# Patient Record
Sex: Female | Born: 1950
Health system: Southern US, Community
[De-identification: ages and names within clinical notes are randomized; demographics above are authoritative.]

## PROBLEM LIST (undated history)

## (undated) DIAGNOSIS — Z860100 Personal history of colon polyps, unspecified: Secondary | ICD-10-CM

## (undated) DIAGNOSIS — D649 Anemia, unspecified: Secondary | ICD-10-CM

## (undated) DIAGNOSIS — M6530 Trigger finger, unspecified finger: Secondary | ICD-10-CM

## (undated) DIAGNOSIS — M1990 Unspecified osteoarthritis, unspecified site: Secondary | ICD-10-CM

## (undated) DIAGNOSIS — E039 Hypothyroidism, unspecified: Secondary | ICD-10-CM

## (undated) HISTORY — DX: Personal history of colon polyps, unspecified: Z86.0100

## (undated) HISTORY — PX: COLONOSCOPY: SHX174

## (undated) HISTORY — DX: Anemia, unspecified: D64.9

## (undated) HISTORY — PX: POLYPECTOMY: SHX149

## (undated) HISTORY — PX: OTHER SURGICAL HISTORY: SHX169

## (undated) HISTORY — PX: WISDOM TOOTH EXTRACTION: SHX21

## (undated) HISTORY — PX: EXPLORATORY LAPAROTOMY: SUR591

## (undated) HISTORY — DX: Trigger finger, unspecified finger: M65.30

## (undated) HISTORY — DX: Personal history of colonic polyps: Z86.010

---

## 2005-10-15 LAB — HM COLONOSCOPY

## 2006-04-02 ENCOUNTER — Encounter: Admission: RE | Admit: 2006-04-02 | Discharge: 2006-04-02 | Payer: Self-pay | Admitting: Family Medicine

## 2007-11-03 ENCOUNTER — Ambulatory Visit: Payer: Self-pay | Admitting: Internal Medicine

## 2007-11-03 DIAGNOSIS — J309 Allergic rhinitis, unspecified: Secondary | ICD-10-CM | POA: Insufficient documentation

## 2007-11-03 DIAGNOSIS — E039 Hypothyroidism, unspecified: Secondary | ICD-10-CM

## 2007-11-03 DIAGNOSIS — M129 Arthropathy, unspecified: Secondary | ICD-10-CM | POA: Insufficient documentation

## 2007-11-03 DIAGNOSIS — Z86010 Personal history of colonic polyps: Secondary | ICD-10-CM | POA: Insufficient documentation

## 2007-11-03 DIAGNOSIS — D649 Anemia, unspecified: Secondary | ICD-10-CM | POA: Insufficient documentation

## 2007-11-04 ENCOUNTER — Ambulatory Visit: Payer: Self-pay | Admitting: Internal Medicine

## 2007-11-04 LAB — CONVERTED CEMR LAB: Vit D, 1,25-Dihydroxy: 28 — ABNORMAL LOW (ref 30–89)

## 2007-11-05 ENCOUNTER — Encounter: Admission: RE | Admit: 2007-11-05 | Discharge: 2007-11-05 | Payer: Self-pay | Admitting: Internal Medicine

## 2007-11-06 ENCOUNTER — Telehealth: Payer: Self-pay | Admitting: *Deleted

## 2007-11-06 LAB — CONVERTED CEMR LAB
Basophils Absolute: 0 10*3/uL (ref 0.0–0.1)
Bilirubin, Direct: 0.2 mg/dL (ref 0.0–0.3)
CO2: 28 meq/L (ref 19–32)
Eosinophils Absolute: 0.1 10*3/uL (ref 0.0–0.6)
Ferritin: 47.1 ng/mL (ref 10.0–291.0)
GFR calc Af Amer: 84 mL/min
Glucose, Bld: 96 mg/dL (ref 70–99)
HCT: 42.6 % (ref 36.0–46.0)
Hemoglobin: 14.6 g/dL (ref 12.0–15.0)
MCHC: 34.4 g/dL (ref 30.0–36.0)
MCV: 89.5 fL (ref 78.0–100.0)
Monocytes Absolute: 0.3 10*3/uL (ref 0.2–0.7)
Monocytes Relative: 7.3 % (ref 3.0–11.0)
Neutrophils Relative %: 57.5 % (ref 43.0–77.0)
Potassium: 5 meq/L (ref 3.5–5.1)
RBC: 4.76 M/uL (ref 3.87–5.11)
TSH: 1.27 microintl units/mL (ref 0.35–5.50)
Total Protein: 6.8 g/dL (ref 6.0–8.3)
Triglycerides: 59 mg/dL (ref 0–149)
Vitamin B-12: 553 pg/mL (ref 211–911)

## 2007-11-11 ENCOUNTER — Ambulatory Visit: Payer: Self-pay | Admitting: Internal Medicine

## 2007-11-11 DIAGNOSIS — E785 Hyperlipidemia, unspecified: Secondary | ICD-10-CM | POA: Insufficient documentation

## 2008-02-09 ENCOUNTER — Ambulatory Visit: Payer: Self-pay | Admitting: Internal Medicine

## 2008-02-10 ENCOUNTER — Telehealth: Payer: Self-pay | Admitting: Internal Medicine

## 2008-02-12 LAB — CONVERTED CEMR LAB
ALT: 33 units/L (ref 0–35)
AST: 23 units/L (ref 0–37)
Bilirubin, Direct: 0.1 mg/dL (ref 0.0–0.3)
Total Bilirubin: 0.8 mg/dL (ref 0.3–1.2)
Total Protein: 6.8 g/dL (ref 6.0–8.3)

## 2008-04-08 ENCOUNTER — Ambulatory Visit: Payer: Self-pay | Admitting: Internal Medicine

## 2008-04-08 LAB — CONVERTED CEMR LAB
Nitrite: NEGATIVE
WBC Urine, dipstick: NEGATIVE
pH: 6.5

## 2008-04-12 LAB — CONVERTED CEMR LAB
ALT: 29 units/L (ref 0–35)
AST: 24 units/L (ref 0–37)
Albumin: 4 g/dL (ref 3.5–5.2)
BUN: 14 mg/dL (ref 6–23)
Basophils Relative: 1.8 % — ABNORMAL HIGH (ref 0.0–1.0)
CO2: 32 meq/L (ref 19–32)
Chloride: 110 meq/L (ref 96–112)
Creatinine, Ser: 1 mg/dL (ref 0.4–1.2)
Eosinophils Absolute: 0.1 10*3/uL (ref 0.0–0.7)
Eosinophils Relative: 2.2 % (ref 0.0–5.0)
Lymphocytes Relative: 37.4 % (ref 12.0–46.0)
MCV: 90.6 fL (ref 78.0–100.0)
Neutrophils Relative %: 51.8 % (ref 43.0–77.0)
RBC: 4.89 M/uL (ref 3.87–5.11)
TSH: 1.23 microintl units/mL (ref 0.35–5.50)
Total Protein: 6.9 g/dL (ref 6.0–8.3)
VLDL: 13 mg/dL (ref 0–40)
WBC: 4 10*3/uL — ABNORMAL LOW (ref 4.5–10.5)

## 2008-04-13 ENCOUNTER — Ambulatory Visit: Payer: Self-pay | Admitting: Internal Medicine

## 2008-04-13 ENCOUNTER — Other Ambulatory Visit: Admission: RE | Admit: 2008-04-13 | Discharge: 2008-04-13 | Payer: Self-pay | Admitting: Internal Medicine

## 2008-04-13 ENCOUNTER — Encounter: Payer: Self-pay | Admitting: Internal Medicine

## 2008-06-02 ENCOUNTER — Ambulatory Visit: Payer: Self-pay | Admitting: Internal Medicine

## 2008-10-05 ENCOUNTER — Encounter: Payer: Self-pay | Admitting: Internal Medicine

## 2008-10-07 ENCOUNTER — Ambulatory Visit: Payer: Self-pay | Admitting: Internal Medicine

## 2008-10-07 LAB — CONVERTED CEMR LAB: Vit D, 1,25-Dihydroxy: 44 (ref 30–89)

## 2008-10-25 ENCOUNTER — Ambulatory Visit: Payer: Self-pay | Admitting: Internal Medicine

## 2008-10-25 LAB — CONVERTED CEMR LAB
Cholesterol, target level: 200 mg/dL
HDL goal, serum: 40 mg/dL

## 2008-12-20 ENCOUNTER — Telehealth: Payer: Self-pay | Admitting: *Deleted

## 2009-05-25 ENCOUNTER — Telehealth: Payer: Self-pay | Admitting: Internal Medicine

## 2009-06-09 ENCOUNTER — Ambulatory Visit: Payer: Self-pay | Admitting: Internal Medicine

## 2009-06-12 LAB — CONVERTED CEMR LAB
Albumin: 4.1 g/dL (ref 3.5–5.2)
Alkaline Phosphatase: 59 units/L (ref 39–117)
Basophils Relative: 1.2 % (ref 0.0–3.0)
Bilirubin, Direct: 0.1 mg/dL (ref 0.0–0.3)
Calcium: 9.8 mg/dL (ref 8.4–10.5)
Eosinophils Relative: 1.7 % (ref 0.0–5.0)
GFR calc non Af Amer: 68.53 mL/min (ref >60)
MCV: 89.9 fL (ref 78.0–100.0)
Monocytes Absolute: 0.4 10*3/uL (ref 0.1–1.0)
Monocytes Relative: 7.4 % (ref 3.0–12.0)
Neutrophils Relative %: 55.6 % (ref 43.0–77.0)
RBC: 4.74 M/uL (ref 3.87–5.11)
Sodium: 144 meq/L (ref 135–145)
TSH: 0.53 microintl units/mL (ref 0.35–5.50)
WBC: 4.8 10*3/uL (ref 4.5–10.5)

## 2009-12-27 ENCOUNTER — Telehealth: Payer: Self-pay | Admitting: *Deleted

## 2010-09-10 ENCOUNTER — Ambulatory Visit: Payer: Self-pay | Admitting: Internal Medicine

## 2010-09-10 LAB — CONVERTED CEMR LAB
Albumin: 4 g/dL (ref 3.5–5.2)
Basophils Absolute: 0 10*3/uL (ref 0.0–0.1)
Basophils Relative: 0.8 % (ref 0.0–3.0)
Bilirubin Urine: NEGATIVE
CO2: 28 meq/L (ref 19–32)
Calcium: 9.6 mg/dL (ref 8.4–10.5)
Chloride: 107 meq/L (ref 96–112)
Eosinophils Absolute: 0.1 10*3/uL (ref 0.0–0.7)
Glucose, Bld: 82 mg/dL (ref 70–99)
HCT: 42 % (ref 36.0–46.0)
HDL: 37.3 mg/dL — ABNORMAL LOW (ref >39.00)
Hemoglobin: 14.4 g/dL (ref 12.0–15.0)
Ketones, ur: NEGATIVE mg/dL
Lymphs Abs: 1.4 10*3/uL (ref 0.7–4.0)
MCHC: 34.3 g/dL (ref 30.0–36.0)
MCV: 89.3 fL (ref 78.0–100.0)
Monocytes Absolute: 0.4 10*3/uL (ref 0.1–1.0)
Neutro Abs: 3 10*3/uL (ref 1.4–7.7)
Nitrite: NEGATIVE
Potassium: 5.7 meq/L — ABNORMAL HIGH (ref 3.5–5.1)
RBC: 4.7 M/uL (ref 3.87–5.11)
RDW: 12.9 % (ref 11.5–14.6)
Sodium: 141 meq/L (ref 135–145)
Specific Gravity, Urine: 1.015 (ref 1.000–1.030)
TSH: 0.8 microintl units/mL (ref 0.35–5.50)
Total CHOL/HDL Ratio: 4
Total Protein, Urine: NEGATIVE mg/dL
Total Protein: 6.4 g/dL (ref 6.0–8.3)
Triglycerides: 62 mg/dL (ref 0.0–149.0)
pH: 6 (ref 5.0–8.0)

## 2010-09-25 ENCOUNTER — Encounter: Payer: Self-pay | Admitting: Internal Medicine

## 2010-09-25 ENCOUNTER — Ambulatory Visit: Payer: Self-pay | Admitting: Internal Medicine

## 2010-10-10 ENCOUNTER — Encounter: Admission: RE | Admit: 2010-10-10 | Discharge: 2010-10-10 | Payer: Self-pay | Admitting: Internal Medicine

## 2010-10-10 LAB — HM MAMMOGRAPHY

## 2010-12-23 ENCOUNTER — Encounter: Payer: Self-pay | Admitting: Family Medicine

## 2011-01-01 NOTE — Assessment & Plan Note (Signed)
Summary: cpx/ssc/pt rsc/cjr   Vital Signs:  Patient profile:   60 year old female Menstrual status:  postmenopausal Height:      65.5 inches Weight:      171 pounds BMI:     28.12 Pulse rate:   78 / minute BP sitting:   120 / 80  (right arm) Cuff size:   regular  Vitals Entered By: Romualdo Bolk, CMA (AAMA) (September 25, 2010 1:40 PM) CC: CPX- Discuss doing a pap.   History of Present Illness: Denise Garrett comes in today  for  preventive visit .  Since last visit  here  there have been no major changes in health status  . feels well  and no injury .   last pap and dexa ok .  Thyroid no change in meds needs refill    Preventive Care Screening  Pap Smear:    Date:  04/13/2008    Results:  normal   Mammogram:    Date:  12/03/2007    Results:  normal   Prior Values:    Pap Smear:  Normal (12/02/2004)    Mammogram:  Normal Bilateral (12/02/2004)    Colonoscopy:  Normal (10/15/2005)   Preventive Screening-Counseling & Management  Alcohol-Tobacco     Alcohol drinks/day: 3     Alcohol type: wine, beer     Smoking Status: never  Caffeine-Diet-Exercise     Caffeine use/day: 2     Does Patient Exercise: yes     Type of exercise: bike, walking, yoga     Times/week: 5  Hep-HIV-STD-Contraception     Dental Visit-last 6 months yes     Sun Exposure-Excessive: no  Safety-Violence-Falls     Seat Belt Use: yes     Firearms in the Home: no firearms in the home     Smoke Detectors: yes     Fall Risk: none      Blood Transfusions:  no.    Current Medications (verified): 1)  Synthroid 88 Mcg  Tabs (Levothyroxine Sodium) .Marland Kitchen.. 1 By Mouth Once Daily 2)  Claritin 10 Mg  Tabs (Loratadine) 3)  Vitamin D 1000 Unit  Tabs (Cholecalciferol) 4)  Tumeric  Allergies (verified): 1)  ! Codeine  Past History:  Past medical, surgical, family and social histories (including risk factors) reviewed, and no changes noted (except as noted below).  Past Medical  History: Allergic rhinitis Anemia-NOS g4p3 Hypothyroidism   dexa 2009  Colonic polyps, hx of     Past Surgical History: Reviewed history from 10/25/2008 and no changes required. Bunion removed Laparotomy-exploratory   Past History:  Care Management: None Current Gastroenterology: unsure of name  Family History: Reviewed history from 06/02/2008 and no changes required. Family History of Cardiovascular disorder-Father-sudden heart attack, Brother quad. bypass at age 77 Family History of CAD Female 1st degree relative  father in 15s        Social History: Reviewed history from 10/25/2008 and no changes required. Occupation: Designer, industrial/product Asst.  [employer] Married Never Smoked Alcohol use-yes   Drug use-no Regular exercise-yes household of 2      pets cats  Dental Care w/in 6 mos.:  yes Seat Belt Use:  yes Sun Exposure-Excessive:  no Fall Risk:  none Blood Transfusions:  no  Review of Systems       had a skipped beat when climbed  to 11th floor   one time    no cp sob GI GU signs bleeding Lumps ,  allergy reaction  .  Physical Exam  General:  Well-developed,well-nourished,in no acute distress; alert,appropriate and cooperative throughout examination Head:  normocephalic and atraumatic.   Eyes:  vision grossly intact, pupils equal, and pupils round.   Ears:  R ear normal, L ear normal, and no external deformities.   Nose:  no external deformity, no external erythema, and no nasal discharge.   Mouth:  good dentition and pharynx pink and moist.   Neck:  No deformities, masses, or tenderness noted. Chest Wall:  No deformities, masses, or tenderness noted. Breasts:  No mass, nodules, thickening, tenderness, bulging, retraction, inflamation, nipple discharge or skin changes noted.   Lungs:  Normal respiratory effort, chest expands symmetrically. Lungs are clear to auscultation, no crackles or wheezes. Heart:  Normal rate and regular rhythm. S1 and S2 normal without gallop,  murmur, click, rub or other extra sounds. Abdomen:  Bowel sounds positive,abdomen soft and non-tender without masses, organomegaly or hernias noted. Genitalia:  defered Msk:  no joint swelling, no joint warmth, no redness over joints, and no joint deformities.   Pulses:  pulses intact without delay   Extremities:  no clubbing cyanosis or edema  Neurologic:  alert & oriented X3, cranial nerves II-XII intact, strength normal in all extremities, gait normal, and DTRs symmetrical and normal.   Skin:  turgor normal, color normal, no ecchymoses, no petechiae, and no purpura.   Cervical Nodes:  No lymphadenopathy noted Axillary Nodes:  No palpable lymphadenopathy Inguinal Nodes:  No significant adenopathy Psych:  Normal eye contact, appropriate affect. Cognition appears normal.    Impression & Recommendations:  Problem # 1:  PREVENTIVE HEALTH CARE (ICD-V70.0) Discussed nutrition,exercise,diet,healthy weight, vitamin D and calcium.   Problem # 2:  HYPOTHYROIDISM (ICD-244.9)  no change in meds    Her updated medication list for this problem includes:    Synthroid 88 Mcg Tabs (Levothyroxine sodium) .Marland Kitchen... 1 by mouth once daily  Labs Reviewed: TSH: 0.80 (09/10/2010)    Chol: 165 (09/10/2010)   HDL: 37.30 (09/10/2010)   LDL: 115 (09/10/2010)   TG: 62.0 (09/10/2010)  Problem # 3:  HYPERLIPIDEMIA (ICD-272.2) inc exercise  avoid.   transfats for low HDL.   Labs Reviewed: SGOT: 21 (09/10/2010)   SGPT: 27 (09/10/2010)  Lipid Goals: Chol Goal: 200 (10/25/2008)   HDL Goal: 40 (10/25/2008)   LDL Goal: 130 (10/25/2008)   TG Goal: 150 (10/25/2008)  Prior 10 Yr Risk Heart Disease: 9 % (10/25/2008)   HDL:37.30 (09/10/2010), 30.7 (10/07/2008)  LDL:115 (09/10/2010), 123 (10/07/2008)  Chol:165 (09/10/2010), 165 (10/07/2008)  Trig:62.0 (09/10/2010), 58 (10/07/2008)  Complete Medication List: 1)  Synthroid 88 Mcg Tabs (Levothyroxine sodium) .Marland Kitchen.. 1 by mouth once daily 2)  Claritin 10 Mg Tabs  (Loratadine) 3)  Vitamin D 1000 Unit Tabs (Cholecalciferol) 4)  Tumeric   Other Orders: Admin 1st Vaccine (16109) Flu Vaccine 40yrs + (60454) Tdap => 7yrs IM (09811) Admin of Any Addtl Vaccine (91478)  Patient Instructions: 1)  get a mammogram this year . 2)  Next year  to do a PAP and DEXA scan. 3)  call if needed in the meantime  Prescriptions: SYNTHROID 88 MCG  TABS (LEVOTHYROXINE SODIUM) 1 by mouth once daily  #90 Tablet x 0   Entered and Authorized by:   Madelin Headings MD   Signed by:   Madelin Headings MD on 09/25/2010   Method used:   Electronically to        [facility]* (retail)             ,  Ph: [phone]       Fax: [phone]   RxID:   [id number]    Orders Added: 1)  Admin 1st Vaccine [90471] 2)  Flu Vaccine 58yrs + [62952] 3)  Tdap => 21yrs IM [84132] 4)  Admin of Any Addtl Vaccine [90472] 5)  Est. Patient 40-64 years [44010]   Immunizations Administered:  Tetanus Vaccine:    Vaccine Type: Tdap    Site: right deltoid    Mfr: GlaxoSmithKline    Dose: 0.5 ml    Route: IM    Given by: Romualdo Bolk, CMA (AAMA)    Exp. Date: 09/20/2012    Lot #: [id number]   Immunizations Administered:  Tetanus Vaccine:    Vaccine Type: Tdap    Site: right deltoid    Mfr: GlaxoSmithKline    Dose: 0.5 ml    Route: IM    Given by: Romualdo Bolk, CMA (AAMA)    Exp. Date: 09/20/2012    Lot #: [id number]  Prevention & Chronic Care Immunizations   Influenza vaccine: Fluvax 3+  (09/25/2010)    Tetanus booster: 09/25/2010: Tdap    Pneumococcal vaccine: Not documented  Colorectal Screening   Hemoccult: Not documented    Colonoscopy: Normal  (10/15/2005)  Other Screening   Pap smear: normal  (04/13/2008)    Mammogram: normal  (12/03/2007)   Mammogram action/deferral: mmg scheduled  (11/03/2007)   Smoking status: never  (09/25/2010)  Lipids   Total Cholesterol: 165  (09/10/2010)   LDL: 115  (09/10/2010)   LDL Direct: Not  documented   HDL: 37.30  (09/10/2010)   Triglycerides: 62.0  (09/10/2010)    SGOT (AST): 21  (09/10/2010)   SGPT (ALT): 27  (09/10/2010)   Alkaline phosphatase: 56  (09/10/2010)   Total bilirubin: 0.8  (09/10/2010)  Self-Management Support :    Lipid self-management support: Not documented   Flu Vaccine Consent Questions     Do you have a history of severe allergic reactions to this vaccine? no    Any prior history of allergic reactions to egg and/or gelatin? no    Do you have a sensitivity to the preservative Thimersol? no    Do you have a past history of Guillan-Barre Syndrome? no    Do you currently have an acute febrile illness? no    Have you ever had a severe reaction to latex? no    Vaccine information given and explained to patient? yes    Are you currently pregnant? no    Lot Number:[id number]   Exp Date:06/01/2011   Site Given  Left Deltoid IM.lbflu1 Romualdo Bolk, CMA (AAMA)  September 25, 2010 1:46 PM   Appended Document: Orders Update EKG  orders  normal EKG    Clinical Lists Changes  Orders: Added new Service order of EKG w/ Interpretation (93000) - Signed

## 2011-01-01 NOTE — Progress Notes (Signed)
Summary: Pt req script for Synthroid via [facility]  Phone Note Call from Patient Call back at Work Phone [phone]   Caller: Patient Summary of Call: Pt req new script for Synthroid via [facility].  Initial call taken by: Lucy Antigua,  December 27, 2009 9:40 AM  Follow-up for Phone Call        Rx sent electronically Follow-up by: Romualdo Bolk, CMA Duncan Dull),  December 27, 2009 12:04 PM    Prescriptions: SYNTHROID 88 MCG  TABS (LEVOTHYROXINE SODIUM) 1 by mouth once daily  #90 x 1   Entered by:   Romualdo Bolk, CMA (AAMA)   Authorized by:   Madelin Headings MD   Signed by:   Romualdo Bolk, CMA (AAMA) on 12/27/2009   Method used:   Electronically to        [facility]* (mail-order)             ,          Ph: [phone]       Fax: [phone]   RxID:   [id number]

## 2011-01-04 ENCOUNTER — Other Ambulatory Visit: Payer: Self-pay | Admitting: Internal Medicine

## 2011-01-14 ENCOUNTER — Encounter: Payer: Self-pay | Admitting: Internal Medicine

## 2011-01-14 ENCOUNTER — Ambulatory Visit (INDEPENDENT_AMBULATORY_CARE_PROVIDER_SITE_OTHER): Payer: BC Managed Care – PPO | Admitting: Internal Medicine

## 2011-01-14 DIAGNOSIS — J40 Bronchitis, not specified as acute or chronic: Secondary | ICD-10-CM | POA: Insufficient documentation

## 2011-01-14 MED ORDER — AMOXICILLIN 875 MG PO TABS: 875.0000 mg | ORAL_TABLET | Freq: Two times a day (BID) | ORAL | Status: AC

## 2011-01-14 MED ORDER — BENZONATATE 100 MG PO CAPS: 100.0000 mg | ORAL_CAPSULE | Freq: Three times a day (TID) | ORAL | Status: AC | PRN

## 2011-01-14 NOTE — Assessment & Plan Note (Signed)
Begin abx tx and tessalon perles prn. Followup if no improvement or worsening.

## 2011-01-14 NOTE — Progress Notes (Signed)
  Subjective:    Patient ID: Denise Garrett, female    DOB: December 02, 1951, 60 y.o.   MRN: [id number]  HPI Pt presents to clinic for evaluation of cough. Notes 8 day h/o NP cough with nasal drainage and congestion. Denies f/c, dysnpea or wheezing. Has attempted otc medication without improvement. Cough worse at night and is impairing sleep. No other alleviating or exacerbating factors.  Reviewed PMH, medications and allergies.    Review of Systems  Constitutional: Negative for fever, chills and fatigue.  HENT: Positive for congestion and rhinorrhea. Negative for ear pain, facial swelling and ear discharge.   Eyes: Negative for discharge and redness.  Respiratory: Positive for cough. Negative for shortness of breath and wheezing.   Skin: Negative for color change and rash.       Objective:   Physical Exam  Constitutional: She appears well-developed and well-nourished. No distress.  HENT:  Head: Normocephalic and atraumatic.  Right Ear: Tympanic membrane, external ear and ear canal normal.  Left Ear: Tympanic membrane, external ear and ear canal normal.  Nose: Nose normal.  Mouth/Throat: Oropharynx is clear and moist. No oropharyngeal exudate.  Eyes: Conjunctivae are normal. No scleral icterus.  Neck: Neck supple.  Cardiovascular: Normal rate, regular rhythm and normal heart sounds.  Exam reveals no gallop and no friction rub.   No murmur heard. Pulmonary/Chest: Effort normal. No respiratory distress. She has no wheezes. She has rales.  Lymphadenopathy:    She has no cervical adenopathy.  Neurological: She is alert.  Skin: No rash noted. She is not diaphoretic. No erythema.          Assessment & Plan:

## 2011-11-30 ENCOUNTER — Other Ambulatory Visit: Payer: Self-pay | Admitting: Internal Medicine

## 2011-12-24 ENCOUNTER — Telehealth: Payer: Self-pay | Admitting: Internal Medicine

## 2011-12-24 NOTE — Telephone Encounter (Signed)
Pt last cpx 2011. Pt is requesting cpx before march. Can I create 30 min slot?

## 2011-12-25 NOTE — Telephone Encounter (Signed)
Yes;   Not on thurs or Friday afternoon

## 2011-12-26 NOTE — Telephone Encounter (Signed)
Lmom for pt to call back. 

## 2011-12-27 NOTE — Telephone Encounter (Signed)
Pt will call back on monday

## 2011-12-31 NOTE — Telephone Encounter (Signed)
cpx sch for 02-14-2012 11am

## 2012-02-10 ENCOUNTER — Other Ambulatory Visit (INDEPENDENT_AMBULATORY_CARE_PROVIDER_SITE_OTHER): Payer: BC Managed Care – PPO

## 2012-02-10 DIAGNOSIS — Z0000 Encounter for general adult medical examination without abnormal findings: Secondary | ICD-10-CM

## 2012-02-10 LAB — HEPATIC FUNCTION PANEL
Alkaline Phosphatase: 53 U/L (ref 39–117)
Bilirubin, Direct: 0.1 mg/dL (ref 0.0–0.3)
Total Bilirubin: 0.4 mg/dL (ref 0.3–1.2)
Total Protein: 6.5 g/dL (ref 6.0–8.3)

## 2012-02-10 LAB — CBC WITH DIFFERENTIAL/PLATELET
Basophils Absolute: 0 10*3/uL (ref 0.0–0.1)
Eosinophils Absolute: 0.1 10*3/uL (ref 0.0–0.7)
Lymphocytes Relative: 31.2 % (ref 12.0–46.0)
MCHC: 33.5 g/dL (ref 30.0–36.0)
MCV: 89.1 fl (ref 78.0–100.0)
Monocytes Absolute: 0.3 10*3/uL (ref 0.1–1.0)
Neutrophils Relative %: 58.6 % (ref 43.0–77.0)
Platelets: 198 10*3/uL (ref 150.0–400.0)
RDW: 13.5 % (ref 11.5–14.6)

## 2012-02-10 LAB — POCT URINALYSIS DIPSTICK
Glucose, UA: NEGATIVE
Spec Grav, UA: 1.02
Urobilinogen, UA: 0.2
pH, UA: 6

## 2012-02-10 LAB — BASIC METABOLIC PANEL
BUN: 20 mg/dL (ref 6–23)
CO2: 27 mEq/L (ref 19–32)
Calcium: 9.2 mg/dL (ref 8.4–10.5)
Chloride: 105 mEq/L (ref 96–112)
Creatinine, Ser: 0.9 mg/dL (ref 0.4–1.2)

## 2012-02-10 LAB — LIPID PANEL
Cholesterol: 164 mg/dL (ref 0–200)
HDL: 42.4 mg/dL (ref >39.00)
LDL Cholesterol: 107 mg/dL — ABNORMAL HIGH (ref 0–99)
Total CHOL/HDL Ratio: 4
Triglycerides: 71 mg/dL (ref 0.0–149.0)
VLDL: 14.2 mg/dL (ref 0.0–40.0)

## 2012-02-14 ENCOUNTER — Other Ambulatory Visit (HOSPITAL_COMMUNITY)
Admission: RE | Admit: 2012-02-14 | Discharge: 2012-02-14 | Disposition: A | Discharge status: Home / Self Care | Payer: BC Managed Care – PPO | Source: Ambulatory Visit | Attending: Internal Medicine | Admitting: Internal Medicine

## 2012-02-14 ENCOUNTER — Encounter: Payer: Self-pay | Admitting: Internal Medicine

## 2012-02-14 ENCOUNTER — Ambulatory Visit (INDEPENDENT_AMBULATORY_CARE_PROVIDER_SITE_OTHER): Payer: BC Managed Care – PPO | Admitting: Internal Medicine

## 2012-02-14 VITALS — BP 148/80 | HR 64 | Ht 65.25 in | Wt 175.0 lb

## 2012-02-14 DIAGNOSIS — Z01419 Encounter for gynecological examination (general) (routine) without abnormal findings: Secondary | ICD-10-CM | POA: Insufficient documentation

## 2012-02-14 DIAGNOSIS — E039 Hypothyroidism, unspecified: Secondary | ICD-10-CM

## 2012-02-14 DIAGNOSIS — E782 Mixed hyperlipidemia: Secondary | ICD-10-CM

## 2012-02-14 DIAGNOSIS — R030 Elevated blood-pressure reading, without diagnosis of hypertension: Secondary | ICD-10-CM

## 2012-02-14 DIAGNOSIS — Z0000 Encounter for general adult medical examination without abnormal findings: Secondary | ICD-10-CM

## 2012-02-14 DIAGNOSIS — J309 Allergic rhinitis, unspecified: Secondary | ICD-10-CM

## 2012-02-14 MED ORDER — LEVOTHYROXINE SODIUM 88 MCG PO TABS: 88.0000 ug | ORAL_TABLET | Freq: Every day | ORAL | Status: DC

## 2012-02-14 NOTE — Patient Instructions (Signed)
Your exam is normal today but her blood pressure is elevated on repeat 140/78.  Please recheck your blood pressure at least 3 different times on different days. If they are below 140/90 and you can check it periodically.  If you get continued blood pressure 140/90 and above  contact us for followup.  -Diet is very helpful for blood pressure control as well as exercise.  We will contact you with Pap smear results.  Continue same thyroid medication optimize calcium and vitamin D in your diet  Check up with labs in a year .

## 2012-02-14 NOTE — Progress Notes (Signed)
Subjective:    Patient ID: Denise Garrett, female    DOB: Aug 10, 1951, 61 y.o.   MRN: [id number]  HPI Patient comes in today for preventive visit and follow-up of medical issues. Update  history since  last visit: Dairy eating healthy.   Some milk takes vitamin D. Thyroid on generic the change in medication taking regularly Blood pressure doesn't check it is never had elevations and no family history. Hasn't been exercising as much recently for nonphysical reasons.   Review of Systems ROS:  GEN/ HEENT: No fever, significant weight changes sweats headaches vision problems hearing changes, CV/ PULM; No chest pain shortness of breath cough, syncope,edema  change in exercise tolerance. GI /GU: No adominal pain, vomiting, change in bowel habits. No blood in the stool. No significant GU symptoms. SKIN/HEME: ,no acute skin rashes suspicious lesions or bleeding. No lymphadenopathy, nodules, masses.  NEURO/ PSYCH:  No neurologic signs such as weakness numbness. No depression anxiety. IMM/ Allergy: No unusual infections.  Allergy .  Sleep disrupted a lot because [place]. Gets woken to good bit.   REST of 12 system review negative except as per HPI  Past history family history social history reviewed in the electronic medical record. Outpatient Prescriptions Prior to Visit  Medication Sig Dispense Refill  . cholecalciferol (VITAMIN D) 1000 UNITS tablet Take 1,000 Units by mouth daily.        Marland Kitchen loratadine (CLARITIN) 10 MG tablet Take 10 mg by mouth daily.        Marland Kitchen levothyroxine (SYNTHROID, LEVOTHROID) 88 MCG tablet TAKE 1 TABLET DAILY  90 tablet  3        Objective:   Physical Exam  Wt Readings from Last 3 Encounters:  02/14/12 175 lb (79.379 kg)  01/14/11 170 lb (77.111 kg)  09/25/10 171 lb (77.565 kg)    Physical Exam: Vital signs reviewed UXL:KGMW is a well-developed well-nourished alert cooperative  white female who appears her stated age in no acute  distress.  HEENT: normocephalic atraumatic , Eyes: PERRL EOM's full, conjunctiva clear, Nares: paten,t no deformity discharge or tenderness., Ears: no deformity EAC's clear TMs with normal landmarks. Mouth: clear OP, no lesions, edema.  Moist mucous membranes. Dentition in adequate repair. NECK: supple without masses, thyromegaly or bruits. CHEST/PULM:  Clear to auscultation and percussion breath sounds equal no wheeze , rales or rhonchi. No chest wall deformities or tenderness. CV: PMI is nondisplaced, S1 S2 no gallops, murmurs, rubs. Peripheral pulses are full without delay.No JVD .  Breast: normal by inspection . No dimpling, discharge, masses, tenderness or discharge . ABDOMEN: Bowel sounds normal nontender  No guard or rebound, no hepato splenomegal no CVA tenderness.  No hernia. Extremtities:  No clubbing cyanosis or edema, no acute joint swelling or redness no focal atrophy NEURO:  Oriented x3, cranial nerves 3-12 appear to be intact, no obvious focal weakness,gait within normal limits no abnormal reflexes or asymmetrical SKIN: No acute rashes normal turgor, color, no bruising or petechiae. PSYCH: Oriented, good eye contact, no obvious depression anxiety, cognition and judgment appear normal. LN: no cervical axillary inguinal adenopathy  Pelvic: NL ext GU, labia clear without lesions or rash . Vagina no lesions .Cervix: clear   Slight flat erythema  No lesionUTERUS: Neg CMT Adnexa:  clear no masses . PAP done rectal neg stool hem tagpresent  Lab Results  Component Value Date   WBC 4.4* 02/10/2012   HGB 13.9 02/10/2012   HCT 41.5 02/10/2012  PLT 198.0 02/10/2012   GLUCOSE 93 02/10/2012   CHOL 164 02/10/2012   TRIG 71.0 02/10/2012   HDL 42.40 02/10/2012   LDLCALC 107* 02/10/2012   ALT 26 02/10/2012   AST 18 02/10/2012   NA 138 02/10/2012   K 4.5 02/10/2012   CL 105 02/10/2012   CREATININE 0.9 02/10/2012   BUN 20 02/10/2012   CO2 27 02/10/2012   TSH 0.79 02/10/2012     Reviewed with patient     Assessment & Plan:  Preventive Health Care Counseled regarding healthy nutrition, exercise, sleep, injury prevention, calcium vit d and healthy weight . Optimize calcium and vitamin D begin exercise her bone health. Disc sleep try whit noise to help with sleep interference   Thyroid   in range continue medication checkup yearly Elevated Bp readings  decreased on repeat should follow this up on her and if elevated get back with [place]. Reviewed normal blood pressure goal readings.  Lipids they're much better over the last 4-5 years continue healthy lifestyle eating. Ratio now in the 4 range

## 2012-02-18 NOTE — Progress Notes (Signed)
Quick Note:  Tell patient PAP is normal. ______ 

## 2012-02-19 ENCOUNTER — Encounter: Payer: Self-pay | Admitting: *Deleted

## 2012-02-19 NOTE — Progress Notes (Signed)
Quick Note:    Letter sent to pt.  ______

## 2012-02-19 NOTE — Progress Notes (Signed)
Quick Note:  Left a message for pt to return call. ______ 

## 2012-07-01 ENCOUNTER — Encounter: Payer: Self-pay | Admitting: Internal Medicine

## 2012-07-01 ENCOUNTER — Ambulatory Visit (INDEPENDENT_AMBULATORY_CARE_PROVIDER_SITE_OTHER): Payer: BC Managed Care – PPO | Admitting: Internal Medicine

## 2012-07-01 VITALS — BP 120/78 | HR 64 | Temp 98.5°F | Wt 172.0 lb

## 2012-07-01 DIAGNOSIS — R194 Change in bowel habit: Secondary | ICD-10-CM

## 2012-07-01 DIAGNOSIS — K5900 Constipation, unspecified: Secondary | ICD-10-CM

## 2012-07-01 DIAGNOSIS — R198 Other specified symptoms and signs involving the digestive system and abdomen: Secondary | ICD-10-CM

## 2012-07-01 NOTE — Patient Instructions (Signed)
It is possible that your change in bowel habits was related to trial him when sure constipated he takes a little while to get back to normal. A we will try some things and if is not improving we will get gastroenterology to see you otherwise we'll refer you at some point for your routine colonoscopy.   Would have to use over-the-counter generic MiraLax one capful a day until going on a regular basis and then as needed.  Continue healthy diet.  If your bowel habits have not returned to baseline  in the next month we will get a gastroenterology consult. Contact us for this.   Constipation in Adults Constipation is having fewer than 2 bowel movements per week. Usually, the stools are hard. As we grow older, constipation is more common. If you try to fix constipation with laxatives, the problem may get worse. This is because laxatives taken over a long period of time make the colon muscles weaker. A low-fiber diet, not taking in enough fluids, and taking some medicines may make these problems worse. MEDICATIONS THAT MAY CAUSE CONSTIPATION  Water pills (diuretics).   Calcium channel blockers (used to control blood pressure and for the heart).   Certain pain medicines (narcotics).   Anticholinergics.   Anti-inflammatory agents.   Antacids that contain aluminum.  DISEASES THAT CONTRIBUTE TO CONSTIPATION  Diabetes.   Parkinson's disease.   Dementia.   Stroke.   Depression.   Illnesses that cause problems with salt and water metabolism.  HOME CARE INSTRUCTIONS   Constipation is usually best cared for without medicines. Increasing dietary fiber and eating more fruits and vegetables is the best way to manage constipation.   Slowly increase fiber intake to 25 to 38 grams per day. Whole grains, fruits, vegetables, and legumes are good sources of fiber. A dietitian can further help you incorporate high-fiber foods into your diet.   Drink enough water and fluids to keep your urine clear  or pale yellow.   A fiber supplement may be added to your diet if you cannot get enough fiber from foods.   Increasing your activities also helps improve regularity.   Suppositories, as suggested by your caregiver, will also help. If you are using antacids, such as aluminum or calcium containing products, it will be helpful to switch to products containing magnesium if your caregiver says it is okay.   If you have been given a liquid injection (enema) today, this is only a temporary measure. It should not be relied on for treatment of longstanding (chronic) constipation.   Stronger measures, such as magnesium sulfate, should be avoided if possible. This may cause uncontrollable diarrhea. Using magnesium sulfate may not allow you time to make it to the bathroom.  SEEK IMMEDIATE MEDICAL CARE IF:   There is bright red blood in the stool.   The constipation stays for more than 4 days.   There is belly (abdominal) or rectal pain.   You do not seem to be getting better.   You have any questions or concerns.  MAKE SURE YOU:   Understand these instructions.   Will watch your condition.   Will get help right away if you are not doing well or get worse.  Document Released: 08/16/2004 Document Revised: 11/07/2011 Document Reviewed: 10/22/2011 [facility] Patient Information 2012 [place], [place].

## 2012-07-04 DIAGNOSIS — K5900 Constipation, unspecified: Secondary | ICD-10-CM | POA: Insufficient documentation

## 2012-07-04 DIAGNOSIS — R194 Change in bowel habit: Secondary | ICD-10-CM | POA: Insufficient documentation

## 2012-07-04 NOTE — Progress Notes (Signed)
Subjective:    Patient ID: Denise Garrett, female    DOB: 07/22/1951, 61 y.o.   MRN: [id number]  HPI Patient comes in today for SDA for  new problem evaluation. Onset of change in bowel habits from regulare to  Hard difficult stools and infrequent for the past 5 weeks.  Had changed diet  At some point to eggs and stopped her smoothies but when resumed no better with constipation.   Uses herbal tea and help after 12 - 15 hours  But incomplete evacuation.  No pain or blood Fever wieght loss NVD.  NO hx of this problem ever.    NO new supplements or meds. Thyroid ok  Did travel to [place] for agout 4 days before onset.    Last colon was about 10 years ago in [place]  ? Hx of polyps . Wants to make sure nothing significant is going on as well as direction for rx. Prefers no harsh reg laxatives.    Review of Systems No systemic sx  As above no cp sob gu sx hematuria or new meds or supplements.  No weight loss night sweats.  Anorexia  No change in thyroid meds  Or missed doses Outpatient Encounter Prescriptions as of 07/01/2012  Medication Sig Dispense Refill  . cholecalciferol (VITAMIN D) 1000 UNITS tablet Take 1,000 Units by mouth daily.        Marland Kitchen levothyroxine (SYNTHROID, LEVOTHROID) 88 MCG tablet Take 1 tablet (88 mcg total) by mouth daily.  90 tablet  3  . loratadine (CLARITIN) 10 MG tablet Take 10 mg by mouth daily.        . NON FORMULARY tumeric       Past history family history social history reviewed in the electronic medical record.     Objective:   Physical Exam BP 120/78  Pulse 64  Temp 98.5 F (36.9 C) (Oral)  Wt 172 lb (78.019 kg) Wt Readings from Last 3 Encounters:  07/01/12 172 lb (78.019 kg)  02/14/12 175 lb (79.379 kg)  01/14/11 170 lb (77.111 kg)   Wdwn in nad  Neck: Supple without adenopathy or masses or bruits Chest:  Clear to A&P without wheezes rales or rhonchi CV:  S1-S2 no gallops or murmurs peripheral perfusion is normal Abdomen:  Sof,t normal bowel sounds without  hepatosplenomegaly, no guarding rebound or masses no CVA tenderness Skin: normal capillary refill ,turgor , color: No acute rashes ,petechiae or bruising  Lab Results  Component Value Date   WBC 4.4* 02/10/2012   HGB 13.9 02/10/2012   HCT 41.5 02/10/2012   PLT 198.0 02/10/2012   GLUCOSE 93 02/10/2012   CHOL 164 02/10/2012   TRIG 71.0 02/10/2012   HDL 42.40 02/10/2012   LDLCALC 107* 02/10/2012   ALT 26 02/10/2012   AST 18 02/10/2012   NA 138 02/10/2012   K 4.5 02/10/2012   CL 105 02/10/2012   CREATININE 0.9 02/10/2012   BUN 20 02/10/2012   CO2 27 02/10/2012   TSH 0.79 02/10/2012       Assessment & Plan:   Change in bowel habits   Constipation without hx of same  Onset after travel and still may be transient but   Low threshold to consult.   Will implement miralax as directed and inc fiber  In diet  . If  Still not returning to normal then Gi consult  Otherwise  Call and we can refer directed for colonoscopy( as she is due soon. And will not be going back  to [facility] for routine care) or for consult as above.

## 2012-11-09 ENCOUNTER — Ambulatory Visit (INDEPENDENT_AMBULATORY_CARE_PROVIDER_SITE_OTHER): Payer: BC Managed Care – PPO | Admitting: Internal Medicine

## 2012-11-09 ENCOUNTER — Encounter: Payer: Self-pay | Admitting: Internal Medicine

## 2012-11-09 VITALS — BP 146/74 | HR 77 | Temp 98.4°F | Wt 179.0 lb

## 2012-11-09 DIAGNOSIS — L659 Nonscarring hair loss, unspecified: Secondary | ICD-10-CM

## 2012-11-09 DIAGNOSIS — G479 Sleep disorder, unspecified: Secondary | ICD-10-CM

## 2012-11-09 DIAGNOSIS — E039 Hypothyroidism, unspecified: Secondary | ICD-10-CM

## 2012-11-09 DIAGNOSIS — R109 Unspecified abdominal pain: Secondary | ICD-10-CM | POA: Insufficient documentation

## 2012-11-09 LAB — POCT URINALYSIS DIPSTICK
Glucose, UA: NEGATIVE
Leukocytes, UA: NEGATIVE
Nitrite, UA: NEGATIVE
Protein, UA: NEGATIVE
Spec Grav, UA: 1.01
Urobilinogen, UA: 0.2

## 2012-11-09 LAB — HEPATIC FUNCTION PANEL
ALT: 36 U/L — ABNORMAL HIGH (ref 0–35)
Albumin: 4.2 g/dL (ref 3.5–5.2)
Alkaline Phosphatase: 59 U/L (ref 39–117)
Bilirubin, Direct: 0 mg/dL (ref 0.0–0.3)
Total Protein: 7.2 g/dL (ref 6.0–8.3)

## 2012-11-09 LAB — CBC WITH DIFFERENTIAL/PLATELET
Basophils Relative: 0.6 % (ref 0.0–3.0)
Eosinophils Absolute: 0.1 10*3/uL (ref 0.0–0.7)
Eosinophils Relative: 1.3 % (ref 0.0–5.0)
Hemoglobin: 14.1 g/dL (ref 12.0–15.0)
Lymphocytes Relative: 25.2 % (ref 12.0–46.0)
MCHC: 32.6 g/dL (ref 30.0–36.0)
MCV: 89.9 fl (ref 78.0–100.0)
Neutro Abs: 3.2 10*3/uL (ref 1.4–7.7)
RBC: 4.83 Mil/uL (ref 3.87–5.11)
WBC: 4.9 10*3/uL (ref 4.5–10.5)

## 2012-11-09 LAB — BASIC METABOLIC PANEL
CO2: 27 mEq/L (ref 19–32)
Calcium: 9.7 mg/dL (ref 8.4–10.5)
Chloride: 104 mEq/L (ref 96–112)
Sodium: 139 mEq/L (ref 135–145)

## 2012-11-09 MED ORDER — ZOLPIDEM TARTRATE 5 MG PO TABS: 5.0000 mg | ORAL_TABLET | Freq: Every evening | ORAL | Status: DC | PRN

## 2012-11-09 NOTE — Progress Notes (Signed)
Chief Complaint  Patient presents with  . Abdominal Pain    The pt is not sleeping for more than 5 hours per night.  Ongoing for 6-7 months.  . Weight Gain  . Alopecia    HPI: Patient comes in today for SDA for  A number of problem  Concerns evaluation. See above.  persistent  Mild abd pain brought her in  2-3/10 and not affected by eating or other.  Up under ribs at toimes  And like a lining of abdomen . No vomiting weight loss fevers night sweats. No blood in her stool no UTI symptoms. She had a change in bowel habits at her last visit but this corrected itself. She is due for colonoscopy as it has been 10 years. She has concerns because her hair is shedding more she is trying to lose weight and doesn't seem to be getting anywhere she's not doing weight watchers but has done it in the past. Is trying to exercise. Over the last few months her sleep has been interrupted no problem falling asleep but awakens at 1 to 2 AM has a hard time falling back to sleep denies anxiety depression as a cause. Denies significant hot flushes as a cause. Denies sleep apnea symptoms. He will go to another room and watch TV until she can fall asleep. She can take Benadryl on the weekends but doesn't her in a week as it causes a hangover-like symptom  caffeine 2 in am   Down on etoh and 1 glass of wine per day.   No difference with sleep.   40 walk at lunch.  ROS: See pertinent positives and negatives per HPI.  Past Medical History  Diagnosis Date  . Allergy   . Anemia   . Thyroid disease     hypothyroidism  . Hx of colonic polyps   . Allergic rhinitis     Family History  Problem Relation Age of Onset  . Heart attack Father   . Coronary artery disease Brother     quad bypass age 2  . Coronary artery disease Father     History   Social History  . Marital Status: Married    Spouse Name: N/A    Number of Children: N/A  . Years of Education: N/A   Social History Main Topics  . Smoking status:  Never Smoker   . Smokeless tobacco: None  . Alcohol Use: Yes  . Drug Use: No  . Sexually Active:    Other Topics Concern  . None   Social History Narrative   Occupation: administrative Asst. [employer]riedRegular exercise- not recently etoh 1 per day or so HH of 2  Pets cats and dog No ets.Considering  retire ing in a couple years.[place] sleep interrupted  With late night noise    Outpatient Encounter Prescriptions as of 11/09/2012  Medication Sig Dispense Refill  . cholecalciferol (VITAMIN D) 1000 UNITS tablet Take 1,000 Units by mouth daily.        Marland Kitchen levothyroxine (SYNTHROID, LEVOTHROID) 88 MCG tablet Take 1 tablet (88 mcg total) by mouth daily.  90 tablet  3  . loratadine (CLARITIN) 10 MG tablet Take 10 mg by mouth daily.        . NON FORMULARY tumeric      . zolpidem (AMBIEN) 5 MG tablet Take 1 tablet (5 mg total) by mouth at bedtime as needed for sleep.  15 tablet  1    EXAM:  BP 146/74  Pulse 77  Temp 98.4 F (36.9 C) (Oral)  Wt 179 lb (81.194 kg)  SpO2 96% Wt Readings from Last 3 Encounters:  11/09/12 179 lb (81.194 kg)  07/01/12 172 lb (78.019 kg)  02/14/12 175 lb (79.379 kg)    There is no height on file to calculate BMI.  GENERAL: vitals reviewed and listed above, alert, oriented, appears well hydrated and in no acute distress looks a bit tired nontoxic.   HEENT: atraumatic, conjunctiva  clear, no obvious abnormalities on inspection of external nose and ears OP : no lesion edema or exudate  no oral lesions  NECK: no obvious masses on inspection palpation   LUNGS: clear to auscultation bilaterally, no wheezes, rales or rhonchi, good air movement  CV: HRRR, no clubbing cyanosis or  peripheral edema nl cap refill  Abdomen soft without organomegaly guarding or rebound bowel sounds are equal I don't feel a mass. Negative psoas sign. MS: moves all extremities without noticeable focal  abnormality Skin no acute changes there is some dryness in the legs but no  acute rashes Scalpicin thinning hair but no scarring alopecia is noted PSYCH: pleasant and cooperative, no obvious depression or anxiety just looks tired. Lab Results  Component Value Date   WBC 4.4* 02/10/2012   HGB 13.9 02/10/2012   HCT 41.5 02/10/2012   PLT 198.0 02/10/2012   GLUCOSE 93 02/10/2012   CHOL 164 02/10/2012   TRIG 71.0 02/10/2012   HDL 42.40 02/10/2012   LDLCALC 107* 02/10/2012   ALT 26 02/10/2012   AST 18 02/10/2012   NA 138 02/10/2012   K 4.5 02/10/2012   CL 105 02/10/2012   CREATININE 0.9 02/10/2012   BUN 20 02/10/2012   CO2 27 02/10/2012   TSH 0.79 02/10/2012    ASSESSMENT AND PLAN:  Discussed the following assessment and plan:  1. Abdominal  pain, other specified site  Basic metabolic panel, CBC with Differential, Hepatic function panel, TSH, Sedimentation rate, Celiac panel 10, POCT urinalysis dipstick, CT Abdomen Pelvis W Contrast, Ambulatory referral to Gastroenterology   Undefined concerning new onset  undefined  get lab ct and gi refer will need colonsocopy   2. Sleep disturbance  Basic metabolic panel, CBC with Differential, Hepatic function panel, TSH, Sedimentation rate, Celiac panel 10, POCT urinalysis dipstick   sounds like habit insomnia short term use of med and parameters to do to avoid dependncy . use 2-3 nights at a time at most.   3. Hair loss  Basic metabolic panel, CBC with Differential, Hepatic function panel, TSH, Sedimentation rate, Celiac panel 10, POCT urinalysis dipstick   r/o thyrois seems like shedding  and not scalp disease  4. HYPOTHYROIDISM  Basic metabolic panel, CBC with Differential, Hepatic function panel, TSH, Sedimentation rate, Celiac panel 10, POCT urinalysis dipstick    -Patient advised to return or notify health care team  immediately if symptoms worsen or persist or new concerns arise.  Patient Instructions  Uncertain cause of the   abd pain   Plan labs today and then abd pelvic ct .  And then if nthe get GI to see you   Biagio Borg are alos  due for a colonoscopy /. Will notify you  of labs when available. Sleep may be a cycling problem . Can use med  For 2-3 days in a row and then off  Med are dependent producing .  Sleep deprivation can make it hard to lose weight.   Insomnia Insomnia is frequent trouble falling and/or staying asleep. Insomnia can be  a long term problem or a short term problem. Both are common. Insomnia can be a short term problem when the wakefulness is related to a certain stress or worry. Long term insomnia is often related to ongoing stress during waking hours and/or poor sleeping habits. Overtime, sleep deprivation itself can make the problem worse. Every little thing feels more severe because you are overtired and your ability to cope is decreased. CAUSES   Stress, anxiety, and depression.  Poor sleeping habits.  Distractions such as TV in the bedroom.  Naps close to bedtime.  Engaging in emotionally charged conversations before bed.  Technical reading before sleep.  Alcohol and other sedatives. They may make the problem worse. They can hurt normal sleep patterns and normal dream activity.  Stimulants such as caffeine for several hours prior to bedtime.  Pain syndromes and shortness of breath can cause insomnia.  Exercise late at night.  Changing time zones may cause sleeping problems (jet lag). It is sometimes helpful to have someone observe your sleeping patterns. They should look for periods of not breathing during the night (sleep apnea). They should also look to see how long those periods last. If you live alone or observers are uncertain, you can also be observed at a sleep clinic where your sleep patterns will be professionally monitored. Sleep apnea requires a checkup and treatment. Give your caregivers your medical history. Give your caregivers observations your family has made about your sleep.  SYMPTOMS   Not feeling rested in the morning.  Anxiety and restlessness at  bedtime.  Difficulty falling and staying asleep. TREATMENT   Your caregiver may prescribe treatment for an underlying medical disorders. Your caregiver can give advice or help if you are using alcohol or other drugs for self-medication. Treatment of underlying problems will usually eliminate insomnia problems.  Medications can be prescribed for short time use. They are generally not recommended for lengthy use.  Over-the-counter sleep medicines are not recommended for lengthy use. They can be habit forming.  You can promote easier sleeping by making lifestyle changes such as:  Using relaxation techniques that help with breathing and reduce muscle tension.  Exercising earlier in the day.  Changing your diet and the time of your last meal. No night time snacks.  Establish a regular time to go to bed.  Counseling can help with stressful problems and worry.  Soothing music and white noise may be helpful if there are background noises you cannot remove.  Stop tedious detailed work at least one hour before bedtime. HOME CARE INSTRUCTIONS   Keep a diary. Inform your caregiver about your progress. This includes any medication side effects. See your caregiver regularly. Take note of:  Times when you are asleep.  Times when you are awake during the night.  The quality of your sleep.  How you feel the next day. This information will help your caregiver care for you.  Get out of bed if you are still awake after 15 minutes. Read or do some quiet activity. Keep the lights down. Wait until you feel sleepy and go back to bed.  Keep regular sleeping and waking hours. Avoid naps.  Exercise regularly.  Avoid distractions at bedtime. Distractions include watching television or engaging in any intense or detailed activity like attempting to balance the household checkbook.  Develop a bedtime ritual. Keep a familiar routine of bathing, brushing your teeth, climbing into bed at the same time  each night, listening to soothing music. Routines increase the success  of falling to sleep faster.  Use relaxation techniques. This can be using breathing and muscle tension release routines. It can also include visualizing peaceful scenes. You can also help control troubling or intruding thoughts by keeping your mind occupied with boring or repetitive thoughts like the old concept of counting sheep. You can make it more creative like imagining planting one beautiful flower after another in your backyard garden.  During your day, work to eliminate stress. When this is not possible use some of the previous suggestions to help reduce the anxiety that accompanies stressful situations. MAKE SURE YOU:   Understand these instructions.  Will watch your condition.  Will get help right away if you are not doing well or get worse. Document Released: 11/15/2000 Document Revised: 02/10/2012 Document Reviewed: 12/16/2007 [facility] Patient Information 2013 [place], [place].      Neta Mends. Emilya Justen M.D.

## 2012-11-09 NOTE — Patient Instructions (Addendum)
Uncertain cause of the   abd pain   Plan labs today and then abd pelvic ct .  And then if nthe get GI to see you   Biagio Borg are alos due for a colonoscopy /. Will notify you  of labs when available. Sleep may be a cycling problem . Can use med  For 2-3 days in a row and then off  Med are dependent producing .  Sleep deprivation can make it hard to lose weight.   Insomnia Insomnia is frequent trouble falling and/or staying asleep. Insomnia can be a long term problem or a short term problem. Both are common. Insomnia can be a short term problem when the wakefulness is related to a certain stress or worry. Long term insomnia is often related to ongoing stress during waking hours and/or poor sleeping habits. Overtime, sleep deprivation itself can make the problem worse. Every little thing feels more severe because you are overtired and your ability to cope is decreased. CAUSES   Stress, anxiety, and depression.  Poor sleeping habits.  Distractions such as TV in the bedroom.  Naps close to bedtime.  Engaging in emotionally charged conversations before bed.  Technical reading before sleep.  Alcohol and other sedatives. They may make the problem worse. They can hurt normal sleep patterns and normal dream activity.  Stimulants such as caffeine for several hours prior to bedtime.  Pain syndromes and shortness of breath can cause insomnia.  Exercise late at night.  Changing time zones may cause sleeping problems (jet lag). It is sometimes helpful to have someone observe your sleeping patterns. They should look for periods of not breathing during the night (sleep apnea). They should also look to see how long those periods last. If you live alone or observers are uncertain, you can also be observed at a sleep clinic where your sleep patterns will be professionally monitored. Sleep apnea requires a checkup and treatment. Give your caregivers your medical history. Give your caregivers observations your  family has made about your sleep.  SYMPTOMS   Not feeling rested in the morning.  Anxiety and restlessness at bedtime.  Difficulty falling and staying asleep. TREATMENT   Your caregiver may prescribe treatment for an underlying medical disorders. Your caregiver can give advice or help if you are using alcohol or other drugs for self-medication. Treatment of underlying problems will usually eliminate insomnia problems.  Medications can be prescribed for short time use. They are generally not recommended for lengthy use.  Over-the-counter sleep medicines are not recommended for lengthy use. They can be habit forming.  You can promote easier sleeping by making lifestyle changes such as:  Using relaxation techniques that help with breathing and reduce muscle tension.  Exercising earlier in the day.  Changing your diet and the time of your last meal. No night time snacks.  Establish a regular time to go to bed.  Counseling can help with stressful problems and worry.  Soothing music and white noise may be helpful if there are background noises you cannot remove.  Stop tedious detailed work at least one hour before bedtime. HOME CARE INSTRUCTIONS   Keep a diary. Inform your caregiver about your progress. This includes any medication side effects. See your caregiver regularly. Take note of:  Times when you are asleep.  Times when you are awake during the night.  The quality of your sleep.  How you feel the next day. This information will help your caregiver care for you.  Get out of  bed if you are still awake after 15 minutes. Read or do some quiet activity. Keep the lights down. Wait until you feel sleepy and go back to bed.  Keep regular sleeping and waking hours. Avoid naps.  Exercise regularly.  Avoid distractions at bedtime. Distractions include watching television or engaging in any intense or detailed activity like attempting to balance the household  checkbook.  Develop a bedtime ritual. Keep a familiar routine of bathing, brushing your teeth, climbing into bed at the same time each night, listening to soothing music. Routines increase the success of falling to sleep faster.  Use relaxation techniques. This can be using breathing and muscle tension release routines. It can also include visualizing peaceful scenes. You can also help control troubling or intruding thoughts by keeping your mind occupied with boring or repetitive thoughts like the old concept of counting sheep. You can make it more creative like imagining planting one beautiful flower after another in your backyard garden.  During your day, work to eliminate stress. When this is not possible use some of the previous suggestions to help reduce the anxiety that accompanies stressful situations. MAKE SURE YOU:   Understand these instructions.  Will watch your condition.  Will get help right away if you are not doing well or get worse. Document Released: 11/15/2000 Document Revised: 02/10/2012 Document Reviewed: 12/16/2007 [facility] Patient Information 2013 [place], [place].

## 2012-11-10 LAB — CELIAC PANEL 10
Gliadin IgA: 2.5 U/mL (ref <20)
Gliadin IgG: 3.5 U/mL (ref <20)
IgA: 162 mg/dL (ref 69–380)
Tissue Transglut Ab: 5.4 U/mL (ref <20)

## 2012-11-11 ENCOUNTER — Encounter: Payer: Self-pay | Admitting: Internal Medicine

## 2012-11-13 ENCOUNTER — Ambulatory Visit (INDEPENDENT_AMBULATORY_CARE_PROVIDER_SITE_OTHER)
Admission: RE | Admit: 2012-11-13 | Discharge: 2012-11-13 | Disposition: A | Discharge status: Home / Self Care | Payer: BC Managed Care – PPO | Source: Ambulatory Visit | Attending: Internal Medicine | Admitting: Internal Medicine

## 2012-11-13 DIAGNOSIS — R109 Unspecified abdominal pain: Secondary | ICD-10-CM

## 2012-11-13 MED ORDER — IOHEXOL 300 MG/ML  SOLN
100.0000 mL | Freq: Once | INTRAMUSCULAR | Status: AC | PRN
  Administered 2012-11-13: 100 mL via INTRAVENOUS

## 2012-12-03 ENCOUNTER — Encounter: Payer: Self-pay | Admitting: Internal Medicine

## 2012-12-08 ENCOUNTER — Ambulatory Visit (INDEPENDENT_AMBULATORY_CARE_PROVIDER_SITE_OTHER): Payer: BC Managed Care – PPO | Admitting: Internal Medicine

## 2012-12-08 ENCOUNTER — Encounter: Payer: Self-pay | Admitting: Internal Medicine

## 2012-12-08 VITALS — BP 120/74 | HR 71 | Ht 65.5 in | Wt 178.8 lb

## 2012-12-08 DIAGNOSIS — R194 Change in bowel habit: Secondary | ICD-10-CM

## 2012-12-08 DIAGNOSIS — R198 Other specified symptoms and signs involving the digestive system and abdomen: Secondary | ICD-10-CM

## 2012-12-08 DIAGNOSIS — K219 Gastro-esophageal reflux disease without esophagitis: Secondary | ICD-10-CM

## 2012-12-08 DIAGNOSIS — Z1211 Encounter for screening for malignant neoplasm of colon: Secondary | ICD-10-CM

## 2012-12-08 MED ORDER — PEG-KCL-NACL-NASULF-NA ASC-C 100 G PO SOLR: 1.0000 | Freq: Once | ORAL | Status: DC

## 2012-12-08 MED ORDER — FAMOTIDINE 40 MG PO TABS: 40.0000 mg | ORAL_TABLET | Freq: Every evening | ORAL | Status: DC

## 2012-12-08 NOTE — Patient Instructions (Addendum)
You have been scheduled for a colonoscopy with propofol. Please follow written instructions given to you at your visit today.  Please pick up your prep kit at the pharmacy within the next 1-3 days. If you use inhalers (even only as needed) or a CPAP machine, please bring them with you on the day of your procedure.  Start taking a daily laxative, take Pepcid 20 mg twice a day for heartburn.

## 2012-12-08 NOTE — Progress Notes (Signed)
Patient ID: Denise Garrett, female   DOB: 01/14/1951, 62 y.o.   MRN: [id number]  SUBJECTIVE: HPI Denise Garrett is a 62 year old female with a past medical history of hypothyroidism and allergic rhinitis who is seen in consultation at the request of Dr. Fabian Sharp for evaluation of change in bowel habits and constipation. The patient reports an abrupt change in bowel habits which began 6 months ago. She reports she is now having constipation requiring laxatives. She's also noticed that her stools are more narrow in caliber. Prior to this change is having one formed, bowel movement daily. She is now having to use over-the-counter laxatives such as MiraLAX, herbal tea, or other generic brand laxatives. These are helping, but she has been worried about becoming dependent on laxatives. She reports diffuse abdominal pain which seems to be somewhat better after bowel movement. Also over this time she's noticed heartburn for the first time in her life. This is not always related to food and it can occur without eating. She denies nausea and vomiting. She does endorse significant abdominal bloating. No dysphagia. She denies changes in medication, and states that her levothyroxine dose has been stable for years. No family history of CRC  Her medical history documents history of colonic polyps, but she recalls her previous colonoscopy to be unremarkable and told to repeat the test in 10 years. She reports her initial screening colonoscopy was 10 years ago.  Review of Systems  As per history of present illness, otherwise negative   Past Medical History  Diagnosis Date  . Allergy   . Anemia   . Thyroid disease     hypothyroidism  . Hx of colonic polyps   . Allergic rhinitis     Current Outpatient Prescriptions  Medication Sig Dispense Refill  . cholecalciferol (VITAMIN D) 1000 UNITS tablet Take 1,000 Units by mouth daily.        Marland Kitchen levothyroxine (SYNTHROID, LEVOTHROID) 88 MCG tablet Take 1 tablet (88 mcg total)  by mouth daily.  90 tablet  3  . loratadine (CLARITIN) 10 MG tablet Take 10 mg by mouth daily.        . NON FORMULARY tumeric      . zolpidem (AMBIEN) 5 MG tablet Take 1 tablet (5 mg total) by mouth at bedtime as needed for sleep.  15 tablet  1  . famotidine (PEPCID) 40 MG tablet Take 1 tablet (40 mg total) by mouth every evening.  30 tablet  1  . peg 3350 powder (MOVIPREP) 100 G SOLR Take 1 kit (100 g total) by mouth once.  1 kit  0    Allergies  Allergen Reactions  . Codeine     REACTION: Nausea and vomiting    Family History  Problem Relation Age of Onset  . Heart attack Father   . Coronary artery disease Brother     quad bypass age 34  . Coronary artery disease Father     History  Substance Use Topics  . Smoking status: Never Smoker   . Smokeless tobacco: Never Used  . Alcohol Use: Yes     Comment: glass of wine every evening    OBJECTIVE: BP 120/74  Pulse 71  Ht 5' 5.5" (1.664 m)  Wt 178 lb 12.8 oz (81.103 kg)  BMI 29.30 kg/m2 Constitutional: Well-developed and well-nourished. No distress. HEENT: Normocephalic and atraumatic. Oropharynx is clear and moist. No oropharyngeal exudate. Conjunctivae are normal. No scleral icterus. Neck: Neck supple. Trachea midline. Cardiovascular: Normal rate, regular rhythm and  intact distal pulses. No M/R/G Pulmonary/chest: Effort normal and breath sounds normal. No wheezing, rales or rhonchi. Abdominal: Soft, nontender, nondistended. Bowel sounds active throughout. There are no masses palpable. No hepatosplenomegaly. Extremities: no clubbing, cyanosis, or edema Lymphadenopathy: No cervical adenopathy noted. Neurological: Alert and oriented to person place and time. Skin: Skin is warm and dry. No rashes noted. Psychiatric: Normal mood and affect. Behavior is normal.  Labs and Imaging -- CBC    Component Value Date/Time   WBC 4.9 11/09/2012 0934   RBC 4.83 11/09/2012 0934   HGB 14.1 11/09/2012 0934   HCT 43.4 11/09/2012 0934    PLT 225.0 11/09/2012 0934   MCV 89.9 11/09/2012 0934   MCHC 32.6 11/09/2012 0934   RDW 12.9 11/09/2012 0934   LYMPHSABS 1.2 11/09/2012 0934   MONOABS 0.3 11/09/2012 0934   EOSABS 0.1 11/09/2012 0934   BASOSABS 0.0 11/09/2012 0934    CMP     Component Value Date/Time   NA 139 11/09/2012 0934   K 4.8 11/09/2012 0934   CL 104 11/09/2012 0934   CO2 27 11/09/2012 0934   GLUCOSE 102* 11/09/2012 0934   BUN 17 11/09/2012 0934   CREATININE 1.0 11/09/2012 0934   CALCIUM 9.7 11/09/2012 0934   PROT 7.2 11/09/2012 0934   ALBUMIN 4.2 11/09/2012 0934   AST 27 11/09/2012 0934   ALT 36* 11/09/2012 0934   ALKPHOS 59 11/09/2012 0934   BILITOT 0.6 11/09/2012 0934   GFRNONAA 71.91 09/10/2010 0755   GFRAA 74 04/08/2008 0824    TSH - normal Celiac panel negative  ASSESSMENT AND PLAN: 62 year old female with a past medical history of hypothyroidism and allergic rhinitis who is seen in consultation at the request of Dr. Fabian Sharp for evaluation of change in bowel habits and constipation.  1.  Change in bowel habits/constipation -- I recommended colonoscopy to better evaluate her change in bowel habits. She is also due for this test from a screening standpoint. In the interim I recommended a daily laxative, specifically MiraLAX 17 g a day. She can use her over-the-counter or herbal tea on a daily basis if this remains effective to improve her constipation. Her TSH was checked and normal, making thyroid disease an unlikely cause of her constipation. We discussed the risks and benefits of colonoscopy today, and she is agreeable to proceed.  2.  Heartburn -- given its close association with her constipation, it may in fact be related. For now we will address her constipation and see if her heartburn issues resolve on their own. I have suggested over-the-counter Pepcid per box instructions on an as-needed basis for severe heartburn.

## 2012-12-10 ENCOUNTER — Ambulatory Visit (INDEPENDENT_AMBULATORY_CARE_PROVIDER_SITE_OTHER): Payer: BC Managed Care – PPO | Admitting: Internal Medicine

## 2012-12-10 ENCOUNTER — Encounter: Payer: Self-pay | Admitting: Internal Medicine

## 2012-12-10 VITALS — BP 124/76 | HR 79 | Temp 98.3°F | Wt 176.0 lb

## 2012-12-10 DIAGNOSIS — E039 Hypothyroidism, unspecified: Secondary | ICD-10-CM

## 2012-12-10 DIAGNOSIS — R7989 Other specified abnormal findings of blood chemistry: Secondary | ICD-10-CM

## 2012-12-10 DIAGNOSIS — G479 Sleep disorder, unspecified: Secondary | ICD-10-CM

## 2012-12-10 DIAGNOSIS — R109 Unspecified abdominal pain: Secondary | ICD-10-CM

## 2012-12-10 DIAGNOSIS — R945 Abnormal results of liver function studies: Secondary | ICD-10-CM

## 2012-12-10 NOTE — Progress Notes (Signed)
Chief Complaint  Patient presents with  . Follow-up    HPI: Patient comes in today for follow up of   medical problems.  abd  Sx  To get colonoscopy . Had gi evaluation Sleep:  Remus Loffler helps some has almost habit wakening at about 230 am. Exercises at lunch otc ocass also tends to clock watch.  Should be better when retires.  ROS: See pertinent positives and negatives per HPI.  Past Medical History  Diagnosis Date  . Allergy   . Anemia   . Thyroid disease     hypothyroidism  . Hx of colonic polyps   . Allergic rhinitis     Family History  Problem Relation Age of Onset  . Heart attack Father   . Coronary artery disease Brother     quad bypass age 16  . Coronary artery disease Father     History   Social History  . Marital Status: Married    Spouse Name: N/A    Number of Children: 1  . Years of Education: N/A   Occupational History  .  [employer]   Social History Main Topics  . Smoking status: Never Smoker   . Smokeless tobacco: Never Used  . Alcohol Use: Yes     Comment: glass of wine every evening  . Drug Use: No  . Sexually Active: None   Other Topics Concern  . None   Social History Narrative   Occupation: administrative Asst. [employer]riedRegular exercise- not recently etoh 1 per day or so HH of 2  Pets cats and dog No ets.Considering  retire ing in a couple years.[place] sleep [place]  With late night noise    Outpatient Encounter Prescriptions as of 12/10/2012  Medication Sig Dispense Refill  . cholecalciferol (VITAMIN D) 1000 UNITS tablet Take 1,000 Units by mouth daily.        Marland Kitchen levothyroxine (SYNTHROID, LEVOTHROID) 88 MCG tablet Take 1 tablet (88 mcg total) by mouth daily.  90 tablet  3  . loratadine (CLARITIN) 10 MG tablet Take 10 mg by mouth daily.        . NON FORMULARY tumeric      . zolpidem (AMBIEN) 5 MG tablet Take 1 tablet (5 mg total) by mouth at bedtime as needed for sleep.  15 tablet  1  . famotidine (PEPCID) 40 MG tablet Take 1 tablet  (40 mg total) by mouth every evening.  30 tablet  1  . peg 3350 powder (MOVIPREP) 100 G SOLR Take 1 kit (100 g total) by mouth once.  1 kit  0    EXAM:  BP 124/76  Pulse 79  Temp 98.3 F (36.8 C) (Oral)  Wt 176 lb (79.833 kg)  SpO2 98%  There is no height on file to calculate BMI.  GENERAL: vitals reviewed and listed above, alert, oriented, appears well hydrated and in no acute distress  MS: moves all extremities without noticeable focal  abnormality  PSYCH: pleasant and cooperative, no obvious depression or anxiety Reviewed labs  Lab Results  Component Value Date   WBC 4.9 11/09/2012   HGB 14.1 11/09/2012   HCT 43.4 11/09/2012   PLT 225.0 11/09/2012   GLUCOSE 102* 11/09/2012   CHOL 164 02/10/2012   TRIG 71.0 02/10/2012   HDL 42.40 02/10/2012   LDLCALC 107* 02/10/2012   ALT 36* 11/09/2012   AST 27 11/09/2012   NA 139 11/09/2012   K 4.8 11/09/2012   CL 104 11/09/2012   CREATININE 1.0 11/09/2012  BUN 17 11/09/2012   CO2 27 11/09/2012   TSH 1.05 11/09/2012    ASSESSMENT AND PLAN:  Discussed the following assessment and plan:  1. Sleep disturbance    reviewed  limiting ambien use call for refill risk bnefit . try no etoh for 2 weeks also   2. HYPOTHYROIDISM    at goal continue yeary check  3. Abdominal  pain, other specified site    change bowel habits evaluating under gi  4. LFTs abnormal    very minor x 1  will follow alt 36    -Patient advised to return or notify health care team  immediately if symptoms worsen or persist or new concerns arise.  Patient Instructions  Continue sleep hygiene   And try off alcohol for 2 weeks to see if  Night waking is less.   Avoid as much clock watching as possibl.   Can get shingles  Vaccine    Call ahead.   ROV when due for yearly check . Call for refills.    Neta Mends. Alyrica Thurow M.D.

## 2012-12-10 NOTE — Patient Instructions (Addendum)
Continue sleep hygiene   And try off alcohol for 2 weeks to see if  Night waking is less.   Avoid as much clock watching as possibl.   Can get shingles  Vaccine    Call ahead.   ROV when due for yearly check . Call for refills.

## 2012-12-25 ENCOUNTER — Other Ambulatory Visit: Payer: Self-pay | Admitting: Internal Medicine

## 2012-12-29 ENCOUNTER — Encounter: Payer: Self-pay | Admitting: Internal Medicine

## 2012-12-29 ENCOUNTER — Other Ambulatory Visit: Payer: Self-pay | Admitting: Family Medicine

## 2012-12-29 MED ORDER — LEVOTHYROXINE SODIUM 88 MCG PO TABS: 88.0000 ug | ORAL_TABLET | Freq: Every day | ORAL | Status: DC

## 2013-01-12 ENCOUNTER — Telehealth: Payer: Self-pay | Admitting: Internal Medicine

## 2013-01-13 ENCOUNTER — Encounter: Payer: BC Managed Care – PPO | Admitting: Internal Medicine

## 2013-01-16 ENCOUNTER — Other Ambulatory Visit: Payer: Self-pay

## 2013-01-18 ENCOUNTER — Encounter: Payer: Self-pay | Admitting: Internal Medicine

## 2013-01-18 ENCOUNTER — Ambulatory Visit (AMBULATORY_SURGERY_CENTER): Payer: BC Managed Care – PPO | Admitting: Internal Medicine

## 2013-01-18 VITALS — BP 114/64 | HR 65 | Temp 96.8°F | Resp 14 | Ht 65.5 in | Wt 178.0 lb

## 2013-01-18 DIAGNOSIS — R198 Other specified symptoms and signs involving the digestive system and abdomen: Secondary | ICD-10-CM

## 2013-01-18 DIAGNOSIS — D126 Benign neoplasm of colon, unspecified: Secondary | ICD-10-CM

## 2013-01-18 DIAGNOSIS — Z1211 Encounter for screening for malignant neoplasm of colon: Secondary | ICD-10-CM

## 2013-01-18 MED ORDER — NA SULFATE-K SULFATE-MG SULF 17.5-3.13-1.6 GM/177ML PO SOLN: 1.0000 | Freq: Once | ORAL | Status: DC

## 2013-01-18 MED ORDER — SODIUM CHLORIDE 0.9 % IV SOLN: 500.0000 mL | INTRAVENOUS | Status: DC

## 2013-01-18 NOTE — Patient Instructions (Addendum)

## 2013-01-18 NOTE — Progress Notes (Signed)
Patient did not experience any of the following events: a burn prior to discharge; a fall within the facility; wrong site/side/patient/procedure/implant event; or a hospital transfer or hospital admission upon discharge from the facility. (G8907) Patient did not have preoperative order for IV antibiotic SSI prophylaxis. (G8918)  

## 2013-01-18 NOTE — Progress Notes (Signed)
NO EGG OR SOY ALLERGY, NO ISSUES WITH SEDATION IN THE PAST. EWM

## 2013-01-18 NOTE — Op Note (Signed)
[facility] [address] [place], [address]   COLONOSCOPY PROCEDURE REPORT  PATIENT: Denise, Garrett  MR#: [id number] BIRTHDATE: [birth year] , 61  yrs. old GENDER: Female ENDOSCOPIST: Beverley Fiedler, MD REFERRED XB:[id number], Neta Mends. PROCEDURE DATE:  01/18/2013 PROCEDURE:   Colonoscopy with snare polypectomy ASA CLASS:   Class II INDICATIONS:average risk screening and Last colonoscopy performed 10 years ago. MEDICATIONS: MAC sedation, administered by CRNA and propofol (Diprivan) 400mg  IV  DESCRIPTION OF PROCEDURE:   After the risks benefits and alternatives of the procedure were thoroughly explained, informed consent was obtained.  A digital rectal exam revealed no rectal mass and A digital rectal exam revealed external hemorrhoids.   The LB [id number]  endoscope was introduced through the anus and advanced to the terminal ileum which was intubated for a short distance. No adverse events experienced.   The quality of the prep was good, using MoviPrep  The instrument was then slowly withdrawn as the colon was fully examined.  COLON FINDINGS: The mucosa appeared normal in the terminal ileum. A pedunculated polyp measuring 7 mm in size was found in the sigmoid colon.  A polypectomy was performed using snare cautery. The resection was complete and the polyp tissue was completely retrieved.   There was mild scattered diverticulosis noted in the descending colon and sigmoid colon.   Small internal and external hemorrhoids were found.  Retroflexed views revealed internal hemorrhoids. The time to cecum=7 minutes 19 seconds.  Withdrawal time=11 minutes 42 seconds.  The scope was withdrawn and the procedure completed. COMPLICATIONS: There were no complications.  ENDOSCOPIC IMPRESSION: 1.   Normal mucosa in the terminal ileum 2.   Pedunculated polyp measuring 7 mm in size was found in the sigmoid colon; polypectomy was performed using snare cautery 3.   There  was mild diverticulosis noted in the descending colon and sigmoid colon 4.   Small internal and external hemorrhoids  RECOMMENDATIONS: 1.  Hold aspirin, aspirin products, and anti-inflammatory medication for 1 week. 2.  Await pathology results 3.  High fiber diet 4.  If the polyp removed today is proven to be an adenomatous (pre-cancerous) polyp, you will need a repeat colonoscopy in 5 years.  Otherwise you should continue to follow colorectal cancer screening guidelines for "routine risk" patients with colonoscopy in 10 years.  You will receive a letter within 1-2 weeks with the results of your biopsy as well as final recommendations.  Please call my office if you have not received a letter after 3 weeks.  eSigned:  Beverley Fiedler, MD 01/18/2013 9:57 AM       cc: Madelin Headings, MD and The Patient

## 2013-01-18 NOTE — Telephone Encounter (Signed)
Sent in bowel prep to [facility]

## 2013-01-19 ENCOUNTER — Telehealth: Payer: Self-pay | Admitting: *Deleted

## 2013-01-19 NOTE — Telephone Encounter (Signed)
  Follow up Call-  Call back number 01/18/2013  Post procedure Call Back phone  # [phone]  Permission to leave phone message Yes     Patient questions:  Do you have a fever, pain , or abdominal swelling? no Pain Score  0 *  Have you tolerated food without any problems? yes  Have you been able to return to your normal activities? yes  Do you have any questions about your discharge instructions: Diet   no Medications  no Follow up visit  no  Do you have questions or concerns about your Care? no  Actions: * If pain score is 4 or above: No action needed, pain <4.

## 2013-01-22 ENCOUNTER — Encounter: Payer: Self-pay | Admitting: Internal Medicine

## 2013-03-17 ENCOUNTER — Encounter: Payer: Self-pay | Admitting: Internal Medicine

## 2013-10-07 ENCOUNTER — Ambulatory Visit (INDEPENDENT_AMBULATORY_CARE_PROVIDER_SITE_OTHER): Payer: BC Managed Care – PPO | Admitting: Internal Medicine

## 2013-10-07 ENCOUNTER — Other Ambulatory Visit: Payer: Self-pay | Admitting: Internal Medicine

## 2013-10-07 ENCOUNTER — Other Ambulatory Visit: Payer: Self-pay

## 2013-10-07 ENCOUNTER — Encounter: Payer: Self-pay | Admitting: Internal Medicine

## 2013-10-07 VITALS — BP 134/70 | HR 78 | Temp 97.8°F | Wt 176.0 lb

## 2013-10-07 DIAGNOSIS — G479 Sleep disorder, unspecified: Secondary | ICD-10-CM

## 2013-10-07 DIAGNOSIS — E039 Hypothyroidism, unspecified: Secondary | ICD-10-CM

## 2013-10-07 DIAGNOSIS — Z2911 Encounter for prophylactic immunotherapy for respiratory syncytial virus (RSV): Secondary | ICD-10-CM

## 2013-10-07 DIAGNOSIS — R7989 Other specified abnormal findings of blood chemistry: Secondary | ICD-10-CM

## 2013-10-07 DIAGNOSIS — Z23 Encounter for immunization: Secondary | ICD-10-CM

## 2013-10-07 DIAGNOSIS — R945 Abnormal results of liver function studies: Secondary | ICD-10-CM | POA: Insufficient documentation

## 2013-10-07 LAB — HEPATIC FUNCTION PANEL
ALT: 23 U/L (ref 0–35)
Albumin: 4 g/dL (ref 3.5–5.2)
Alkaline Phosphatase: 55 U/L (ref 39–117)
Bilirubin, Direct: 0.1 mg/dL (ref 0.0–0.3)
Total Protein: 7 g/dL (ref 6.0–8.3)

## 2013-10-07 LAB — BASIC METABOLIC PANEL
CO2: 27 mEq/L (ref 19–32)
Chloride: 105 mEq/L (ref 96–112)
Creatinine, Ser: 0.9 mg/dL (ref 0.4–1.2)
Glucose, Bld: 93 mg/dL (ref 70–99)
Sodium: 139 mEq/L (ref 135–145)

## 2013-10-07 MED ORDER — ZOLPIDEM TARTRATE 5 MG PO TABS: 5.0000 mg | ORAL_TABLET | Freq: Every evening | ORAL | Status: DC | PRN

## 2013-10-07 NOTE — Patient Instructions (Signed)
Continue sleep hygiene. Will notify you  of labs when available. Call for refills . If ok can cpx or ROV in 1 year.

## 2013-10-07 NOTE — Progress Notes (Signed)
Chief Complaint  Patient presents with  . Follow-up    HPI: Here for FU of medi ation evaluation.   Doing great   Better with sleep.   ambien broke cycle.  Takes About every  2-3 weeks.  Takes 1/2 of some white noise.  Depends on when neighbor drive lambhorgini party night . Otherwise sleeps well  Retiring in 6 weeks.  Due for refill thyroid med  No change on generic .  Feels well utd on hcm  Never tested for hep c but has had needle sticks in past.  etoh :1 per night wine ocass 2 on weekend.  ROS: See pertinent positives and negatives per HPI.  Past Medical History  Diagnosis Date  . Allergy   . Anemia   . Thyroid disease     hypothyroidism  . Hx of colonic polyps   . Allergic rhinitis     Family History  Problem Relation Age of Onset  . Heart attack Father   . Coronary artery disease Father   . Coronary artery disease Brother     quad bypass age 51  . Colon cancer Neg Hx   . Esophageal cancer Neg Hx   . Prostate cancer Neg Hx   . Rectal cancer Neg Hx     History   Social History  . Marital Status: Married    Spouse Name: N/A    Number of Children: 1  . Years of Education: N/A   Occupational History  .  [employer]   Social History Main Topics  . Smoking status: Never Smoker   . Smokeless tobacco: Never Used  . Alcohol Use: 4.2 oz/week    7 Glasses of wine per week     Comment: glass of wine every evening  . Drug Use: No  . Sexual Activity: None   Other Topics Concern  . 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   Considering  retire ing in a couple years.   [place] sleep [place]  With late night noise    Outpatient Encounter Prescriptions as of 10/07/2013  Medication Sig  . cetirizine (ZYRTEC) 10 MG tablet Take 10 mg by mouth daily.  . cholecalciferol (VITAMIN D) 1000 UNITS tablet Take 1,000  Units by mouth daily.    Marland Kitchen levothyroxine (SYNTHROID, LEVOTHROID) 88 MCG tablet Take 1 tablet (88 mcg total) by mouth daily.  . NON FORMULARY tumeric  . zolpidem (AMBIEN) 5 MG tablet Take 1 tablet (5 mg total) by mouth at bedtime as needed for sleep.  . [DISCONTINUED] loratadine (CLARITIN) 10 MG tablet Take 10 mg by mouth daily.    . [DISCONTINUED] zolpidem (AMBIEN) 5 MG tablet Take 1 tablet (5 mg total) by mouth at bedtime as needed for sleep.  . Na Sulfate-K Sulfate-Mg Sulf (SUPREP BOWEL PREP) SOLN Take 1 kit by mouth once.  . [DISCONTINUED] aspirin 81 MG tablet Take 81 mg by mouth daily.  . [DISCONTINUED] famotidine (PEPCID) 40 MG tablet Take 1 tablet (40 mg total) by mouth every evening.    EXAM:  BP 134/70  Pulse 78  Temp(Src) 97.8 F (36.6 C) (Oral)  Wt 176 lb (79.833 kg)  SpO2 98%  Body mass index is 28.83 kg/(m^2).  GENERAL: vitals reviewed and listed above,  alert, oriented, appears well hydrated and in no acute distress HEENT: atraumatic, conjunctiva  clear, no obvious abnormalities on inspection of external nose and ears OP : no lesion edema or exudate  NECK: no obvious masses on inspection palpation thyroid palp no nodules LUNGS: clear to auscultation bilaterally, no wheezes, rales or rhonchi, good air movement CV: HRRR, no clubbing cyanosis or  peripheral edema nl cap refill  Abdomen:  Sof,t normal bowel sounds without hepatosplenomegaly, no guarding rebound or masses no CVA tenderness MS: moves all extremities without noticeable focal  abnormality PSYCH: pleasant and cooperative, no obvious depression or anxiety Lab Results  Component Value Date   WBC 4.9 11/09/2012   HGB 14.1 11/09/2012   HCT 43.4 11/09/2012   PLT 225.0 11/09/2012   GLUCOSE 102* 11/09/2012   CHOL 164 02/10/2012   TRIG 71.0 02/10/2012   HDL 42.40 02/10/2012   LDLCALC 107* 02/10/2012   ALT 36* 11/09/2012   AST 27 11/09/2012   NA 139 11/09/2012   K 4.8 11/09/2012   CL 104 11/09/2012   CREATININE 1.0  11/09/2012   BUN 17 11/09/2012   CO2 27 11/09/2012   TSH 1.05 11/09/2012    ASSESSMENT AND PLAN:  Discussed the following assessment and plan:  HYPOTHYROIDISM - Plan: Basic metabolic panel, Hepatic function panel, TSH, Hepatitis C antibody  Abnormal LFTs - minimal repeat hep c screen today - Plan: Basic metabolic panel, Hepatic function panel, TSH, Hepatitis C antibody  Sleep disturbance - using med low dose situational benefiot more than risk at this time. refill check in a year at wellness visit  - Plan: Basic metabolic panel, Hepatic function panel, TSH, Hepatitis C antibody  Need for shingles vaccine - Plan: Varicella-zoster vaccine subcutaneous Had oatmeal breakfast this am  Pt is signed up for my chart .  -Patient advised to return or notify health care team  if symptoms worsen or persist or new concerns arise.  Patient Instructions  Continue sleep hygiene. Will notify you  of labs when available. Call for refills . If ok can cpx or ROV in 1 year.    Neta Mends. Kataleah Bejar M.D.

## 2013-10-11 ENCOUNTER — Encounter: Payer: Self-pay | Admitting: Family Medicine

## 2013-12-03 ENCOUNTER — Encounter: Payer: Self-pay | Admitting: Internal Medicine

## 2013-12-03 ENCOUNTER — Ambulatory Visit (INDEPENDENT_AMBULATORY_CARE_PROVIDER_SITE_OTHER): Payer: BC Managed Care – PPO | Admitting: Internal Medicine

## 2013-12-03 VITALS — BP 140/86 | HR 69 | Temp 99.2°F | Wt 179.0 lb

## 2013-12-03 DIAGNOSIS — R1900 Intra-abdominal and pelvic swelling, mass and lump, unspecified site: Secondary | ICD-10-CM

## 2013-12-03 DIAGNOSIS — R1032 Left lower quadrant pain: Secondary | ICD-10-CM | POA: Insufficient documentation

## 2013-12-03 DIAGNOSIS — R1909 Other intra-abdominal and pelvic swelling, mass and lump: Secondary | ICD-10-CM | POA: Insufficient documentation

## 2013-12-03 NOTE — Progress Notes (Signed)
Pre visit review using our clinic review tool, if applicable. No additional management support is needed unless otherwise documented below in the visit note. Chief Complaint  Patient presents with  . Mass    Has had pain in her left groin for months.  Noticed a new lump/mass 2 days ago.    HPI: Patient comes in today for SDA for  new problem evaluation. Patient has been dealing with some pain along her left upper thigh for a while number of months sees her chiropractor for this and that not getting better. Then she noticed 2 days ago when she was examining the area of lumpiness in the left groin area. Ensure how long it narrow but it isn't really tender at that area she states when she's moving her left leg around tried getting into the car or areas it'll click and catch and be painful. Denies any weakness or specific injury. ROS: See pertinent positives and negatives per HPI. No unusual fever weight loss. See chiro  For ms sx   Past Medical History  Diagnosis Date  . Allergy   . Anemia   . Thyroid disease     hypothyroidism  . Hx of colonic polyps   . Allergic rhinitis   . Trigger finger     Family History  Problem Relation Age of Onset  . Heart attack Father   . Coronary artery disease Father   . Coronary artery disease Brother     quad bypass age 80  . Colon cancer Neg Hx   . Esophageal cancer Neg Hx   . Prostate cancer Neg Hx   . Rectal cancer Neg Hx     History   Social History  . Marital Status: Married    Spouse Name: N/A    Number of Children: 1  . Years of Education: N/A   Occupational History  .  [employer]   Social History Main Topics  . Smoking status: Never Smoker   . Smokeless tobacco: Never Used  . Alcohol Use: 4.2 oz/week    7 Glasses of wine per week     Comment: glass of wine every evening  . Drug Use: No  . Sexual Activity: None   Other Topics Concern  . 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   Considering  retire ing in a couple years.   [place]  With late night noise    Outpatient Encounter Prescriptions as of 12/03/2013  Medication Sig  . cetirizine (ZYRTEC) 10 MG tablet Take 10 mg by mouth daily.  . cholecalciferol (VITAMIN D) 1000 UNITS tablet Take 1,000 Units by mouth daily.    Marland Kitchen levothyroxine (SYNTHROID, LEVOTHROID) 88 MCG tablet TAKE 1 TABLET DAILY  . Na Sulfate-K Sulfate-Mg Sulf (SUPREP BOWEL PREP) SOLN Take 1 kit by mouth once.  . NON FORMULARY tumeric  . zolpidem (AMBIEN) 5 MG tablet Take 1 tablet (5 mg total) by mouth at bedtime as needed for sleep.    EXAM:  BP 140/86  Pulse 69  Temp(Src) 99.2 F (37.3 C) (Oral)  Wt 179 lb (81.194 kg)  SpO2 97%  Body mass index is 29.32 kg/(m^2).  GENERAL: vitals reviewed and listed above, alert, oriented, appears well hydrated and in no acute  distress Abdomen:  Sof,t normal bowel sounds without hepatosplenomegaly, no guarding rebound or masses no CVA tenderness Inguinal area below the ligament there is an ovoid soft  1cm x 3-4 cm miblile non toender  Mass effect left and not in right   oulses ok  Has some disdcomfort on hip abduction and rotation  No swelling or redness  MS: moves all extremities without noticeable focal  abnormality PSYCH: pleasant and cooperative, no obvious depression or anxiety  ASSESSMENT AND PLAN:  Discussed the following assessment and plan:  Lt groin pain - with radiation  - Plan: Ambulatory referral to Orthopedic Surgery  Groin swelling - Plan: Ambulatory referral to Orthopedic Surgery Discussed with patient and don't think this is a lymph gland healing reminiscent of her hernia but appears to be in the wrong place but with her pain that could be hip related we'll get orthopedics to see her and go from there. -Patient advised to return or notify health care team  if  symptoms worsen or persist or new concerns arise.  Patient Instructions  Uncertain cause of the swelling  Feel slike a hernia but in the wroing location  Although the groin leg pain is reminescaent of a groin or hip problem  Doesn't seem like a lymph gland eigher.   Will arrange orthoSm appt to help delineated pain and swelling cause .  Can try  Aleve twice a day for 1- 2 weeks in the mean time if not already tried.      Standley Brooking. Panosh M.D.

## 2013-12-03 NOTE — Patient Instructions (Signed)
Uncertain cause of the swelling  Feel slike a hernia but in the wroing location  Although the groin leg pain is reminescaent of a groin or hip problem  Doesn't seem like a lymph gland eigher.   Will arrange orthoSm appt to help delineated pain and swelling cause .  Can try  Aleve twice a day for 1- 2 weeks in the mean time if not already tried.

## 2014-03-17 ENCOUNTER — Telehealth: Payer: Self-pay | Admitting: Internal Medicine

## 2014-03-17 NOTE — Telephone Encounter (Signed)
Pt is going on a international flight to [place]  4-28 and will return on 04-18-14. Pt has flight anxiety and would like valium call into [facility] on [address]

## 2014-03-17 NOTE — Telephone Encounter (Signed)
Left a message at the below listed number for the pt to return my call. 

## 2014-03-17 NOTE — Telephone Encounter (Signed)
i would use lorazepam instead of valium because  Of how liong it lasts.  Lorazepam Total visit 57mins > 50% spent counseling and coordinating care    mg take 1-2 po tid as needed for flight anxiety .  Disp : 14# no refills

## 2014-03-18 MED ORDER — LORAZEPAM 0.5 MG PO TABS: ORAL_TABLET | ORAL | Status: DC

## 2014-03-18 NOTE — Telephone Encounter (Signed)
Pt notified that rx called to the office. Left on voicemail.

## 2014-08-15 ENCOUNTER — Other Ambulatory Visit: Payer: Self-pay

## 2014-08-15 DIAGNOSIS — Z1231 Encounter for screening mammogram for malignant neoplasm of breast: Secondary | ICD-10-CM

## 2014-08-18 ENCOUNTER — Ambulatory Visit
Admission: RE | Admit: 2014-08-18 | Discharge: 2014-08-18 | Disposition: A | Discharge status: Home / Self Care | Payer: BC Managed Care – PPO | Source: Ambulatory Visit

## 2014-08-18 DIAGNOSIS — Z1231 Encounter for screening mammogram for malignant neoplasm of breast: Secondary | ICD-10-CM

## 2014-09-07 ENCOUNTER — Ambulatory Visit (INDEPENDENT_AMBULATORY_CARE_PROVIDER_SITE_OTHER): Payer: BC Managed Care – PPO

## 2014-09-07 DIAGNOSIS — Z23 Encounter for immunization: Secondary | ICD-10-CM

## 2014-09-09 ENCOUNTER — Other Ambulatory Visit: Payer: Self-pay | Admitting: Internal Medicine

## 2014-09-09 NOTE — Telephone Encounter (Signed)
Sent to the pharmacy by e-scribe.  Pt has upcoming CPX on 10/26/14

## 2014-09-29 ENCOUNTER — Other Ambulatory Visit: Payer: Self-pay | Admitting: Internal Medicine

## 2014-10-02 NOTE — Telephone Encounter (Signed)
ok disp 15 no refill

## 2014-10-03 NOTE — Telephone Encounter (Signed)
Called to the pharmacy and left on voicemail. 

## 2014-10-19 ENCOUNTER — Other Ambulatory Visit (INDEPENDENT_AMBULATORY_CARE_PROVIDER_SITE_OTHER): Payer: BC Managed Care – PPO

## 2014-10-19 DIAGNOSIS — Z0000 Encounter for general adult medical examination without abnormal findings: Secondary | ICD-10-CM

## 2014-10-19 LAB — LIPID PANEL
CHOLESTEROL: 192 mg/dL (ref 0–200)
HDL: 42.2 mg/dL (ref >39.00)
LDL Cholesterol: 137 mg/dL — ABNORMAL HIGH (ref 0–99)
NONHDL: 149.8
Total CHOL/HDL Ratio: 5
Triglycerides: 62 mg/dL (ref 0.0–149.0)
VLDL: 12.4 mg/dL (ref 0.0–40.0)

## 2014-10-19 LAB — HEPATIC FUNCTION PANEL
ALT: 24 U/L (ref 0–35)
AST: 20 U/L (ref 0–37)
Albumin: 4.2 g/dL (ref 3.5–5.2)
Alkaline Phosphatase: 53 U/L (ref 39–117)
Bilirubin, Direct: 0 mg/dL (ref 0.0–0.3)
TOTAL PROTEIN: 7 g/dL (ref 6.0–8.3)
Total Bilirubin: 0.6 mg/dL (ref 0.2–1.2)

## 2014-10-19 LAB — BASIC METABOLIC PANEL
BUN: 23 mg/dL (ref 6–23)
CHLORIDE: 109 meq/L (ref 96–112)
CO2: 25 mEq/L (ref 19–32)
Calcium: 9.7 mg/dL (ref 8.4–10.5)
Creatinine, Ser: 1 mg/dL (ref 0.4–1.2)
GFR: 60.29 mL/min (ref >60.00)
Glucose, Bld: 89 mg/dL (ref 70–99)
POTASSIUM: 5.2 meq/L — AB (ref 3.5–5.1)
SODIUM: 144 meq/L (ref 135–145)

## 2014-10-19 LAB — CBC WITH DIFFERENTIAL/PLATELET
BASOS PCT: 0.9 % (ref 0.0–3.0)
Basophils Absolute: 0 10*3/uL (ref 0.0–0.1)
EOS PCT: 2.6 % (ref 0.0–5.0)
Eosinophils Absolute: 0.1 10*3/uL (ref 0.0–0.7)
HEMATOCRIT: 44 % (ref 36.0–46.0)
HEMOGLOBIN: 14.4 g/dL (ref 12.0–15.0)
LYMPHS ABS: 1.6 10*3/uL (ref 0.7–4.0)
Lymphocytes Relative: 36.2 % (ref 12.0–46.0)
MCHC: 32.6 g/dL (ref 30.0–36.0)
MCV: 89.3 fl (ref 78.0–100.0)
MONO ABS: 0.4 10*3/uL (ref 0.1–1.0)
Monocytes Relative: 8.6 % (ref 3.0–12.0)
NEUTROS ABS: 2.2 10*3/uL (ref 1.4–7.7)
Neutrophils Relative %: 51.7 % (ref 43.0–77.0)
Platelets: 231 10*3/uL (ref 150.0–400.0)
RBC: 4.93 Mil/uL (ref 3.87–5.11)
RDW: 13.1 % (ref 11.5–15.5)
WBC: 4.3 10*3/uL (ref 4.0–10.5)

## 2014-10-19 LAB — TSH: TSH: 2.1 u[IU]/mL (ref 0.35–4.50)

## 2014-10-26 ENCOUNTER — Ambulatory Visit (INDEPENDENT_AMBULATORY_CARE_PROVIDER_SITE_OTHER): Payer: BC Managed Care – PPO | Admitting: Internal Medicine

## 2014-10-26 ENCOUNTER — Encounter: Payer: Self-pay | Admitting: Internal Medicine

## 2014-10-26 VITALS — BP 126/70 | Temp 98.2°F | Ht 65.0 in | Wt 179.1 lb

## 2014-10-26 DIAGNOSIS — Z8249 Family history of ischemic heart disease and other diseases of the circulatory system: Secondary | ICD-10-CM | POA: Insufficient documentation

## 2014-10-26 DIAGNOSIS — Z0000 Encounter for general adult medical examination without abnormal findings: Secondary | ICD-10-CM

## 2014-10-26 DIAGNOSIS — E039 Hypothyroidism, unspecified: Secondary | ICD-10-CM

## 2014-10-26 NOTE — Patient Instructions (Signed)
Continue lifestyle intervention healthy eating and exercise . No change in meds .  Healthy lifestyle includes : At least 150 minutes of exercise weeks  , weight at healthy levels, which is usually   BMI 19-25. Avoid trans fats and processed foods;  Increase fresh fruits and veges to 5 servings per day. And avoid sweet beverages including tea and juice. Mediterranean diet with olive oil and nuts have been noted to be heart and brain healthy . Avoid tobacco products . Limit  alcohol to  7 per week for women and 14 servings for men.  Get adequate sleep . Wear seat belts . Don't text and drive .   Consider getting coronary artery calcium score screen   To  fpor risk assessment  Consideration ofof adding a statin med etc . Make sure blood pressure still controlled

## 2014-10-26 NOTE — Progress Notes (Signed)
Pre visit review using our clinic review tool, if applicable. No additional management support is needed unless otherwise documented below in the visit note.  Chief Complaint  Patient presents with  . Annual Exam  . Hypothyroidism    HPI: Patient  Denise Garrett  63 y.o. comes in today for Preventive Health Care visit  No major change in health status since last visit . Thyroid Takes med daily no problem  Rare use of ambien   As needed  1/2 one or 2 monthly  Uses ativan for flying if needed ? About risk fam hx early cad father and brother   Had no inkling and had healthy ls  Health Maintenance  Topic Date Due  . PAP SMEAR  02/14/2015  . INFLUENZA VACCINE  07/03/2015  . MAMMOGRAM  08/18/2016  . COLONOSCOPY  01/18/2018  . TETANUS/TDAP  09/25/2020  . ZOSTAVAX  Completed   Health Maintenance Review LIFESTYLE:  Exercise:  Walk every day  golf Tobacco/ETS:no Alcohol: about once a day . Sugar beverages: no Sleep: 7 hours - 8  Drug use: no Colonoscopy:  utd due 2019  PAP:due 20216  MAMMO: utd  ROS:  GEN/ HEENT: No fever, significant weight changes sweats headaches vision problems hearing changes, CV/ PULM; No chest pain shortness of breath cough, syncope,edema  change in exercise tolerance. GI /GU: No adominal pain, vomiting, change in bowel habits. No blood in the stool. No significant GU symptoms. SKIN/HEME: ,no acute skin rashes suspicious lesions or bleeding. No lymphadenopathy, nodules, masses.  NEURO/ PSYCH:  No neurologic signs such as weakness numbness. No depression anxiety. IMM/ Allergy: No unusual infections.  Allergy .   REST of 12 system review negative except as per HPI   Past Medical History  Diagnosis Date  . Allergy   . Anemia   . Thyroid disease     hypothyroidism  . Hx of colonic polyps   . Allergic rhinitis   . Trigger finger     Past Surgical History  Procedure Laterality Date  . Exploratory laparotomy    . Bunion removed    . Polypectomy      . Colonoscopy    . Wisdom tooth extraction      Family History  Problem Relation Age of Onset  . Heart attack Father   . Coronary artery disease Father   . Coronary artery disease Brother     quad bypass age 23  . Colon cancer Neg Hx   . Esophageal cancer Neg Hx   . Prostate cancer Neg Hx   . Rectal cancer Neg Hx     History   Social History  . Marital Status: Married    Spouse Name: N/A    Number of Children: 1  . Years of Education: N/A   Occupational History  .  [employer]   Social History Main Topics  . Smoking status: Never Smoker   . Smokeless tobacco: Never Used  . Alcohol Use: 4.2 oz/week    7 Glasses of wine per week     Comment: glass of wine every evening  . Drug Use: No  . Sexual Activity: None   Other Topics Concern  . 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   Considering  retire  ing in a couple years.   [place]  With late night noise    Outpatient Encounter Prescriptions as of 10/26/2014  Medication Sig  . cetirizine (ZYRTEC) 10 MG tablet Take 10 mg by mouth daily.  . cholecalciferol (VITAMIN D) 1000 UNITS tablet Take 1,000 Units by mouth daily.    Marland Kitchen levothyroxine (SYNTHROID, LEVOTHROID) 88 MCG tablet TAKE 1 TABLET DAILY  . LORazepam (ATIVAN) 0.5 MG tablet Take 1-2 tablets by mouth three times daily as needed.  . Na Sulfate-K Sulfate-Mg Sulf (SUPREP BOWEL PREP) SOLN Take 1 kit by mouth once.  . NON FORMULARY tumeric  . zolpidem (AMBIEN) 5 MG tablet take 1 tablet by mouth nightly at bedtime as needed for sleep.    EXAM:  BP 126/70 mmHg  Temp(Src) 98.2 F (36.8 C) (Oral)  Ht _0  (1.651 m)  Wt 179 lb 1.6 oz (81.239 kg)  BMI 29.80 kg/m2  Body mass index is 29.8 kg/(m^2).  Physical Exam: Vital signs reviewed UTM:LYYT is a well-developed well-nourished alert cooperative     who appearsr stated age in no acute distress.  HEENT: normocephalic atraumatic , Eyes: PERRL EOM's full, conjunctiva clear, Nares: paten,t no deformity discharge or tenderness., Ears: no deformity EAC's clear TMs with normal landmarks. Mouth: clear OP, no lesions, edema.  Moist mucous membranes. Dentition in adequate repair. NECK: supple without masses, thyromegaly or bruits. CHEST/PULM:  Clear to auscultation and percussion breath sounds equal no wheeze , rales or rhonchi. No chest wall deformities or tenderness. CV: PMI is nondisplaced, S1 S2 no gallops, murmurs, rubs. Peripheral pulses are full without delay.No JVD .  Breast: normal by inspection . No dimpling, discharge, masses, tenderness or discharge . ABDOMEN: Bowel sounds normal nontender  No guard or rebound, no hepato splenomegal no CVA tenderness.  No hernia. Extremtities:  No clubbing cyanosis or edema, no acute joint swelling or redness no focal atrophy NEURO:  Oriented x3, cranial nerves 3-12 appear to be intact, no obvious focal weakness,gait within normal limits no abnormal reflexes or asymmetrical SKIN: No acute rashes normal turgor, color, no bruising or petechiae. PSYCH: Oriented, good eye contact, no obvious depression anxiety, cognition and judgment appear normal. LN: no cervical axillary inguinal adenopathy  Lab Results  Component Value Date   WBC 4.3 10/19/2014   HGB 14.4 10/19/2014   HCT 44.0 10/19/2014   PLT 231.0 10/19/2014   GLUCOSE 89 10/19/2014   CHOL 192 10/19/2014   TRIG 62.0 10/19/2014   HDL 42.20 10/19/2014   LDLCALC 137* 10/19/2014   ALT 24 10/19/2014   AST 20 10/19/2014   NA 144 10/19/2014   K 5.2* 10/19/2014   CL 109 10/19/2014   CREATININE 1.0 10/19/2014   BUN 23 10/19/2014   CO2 25 10/19/2014   TSH 2.10 10/19/2014    ASSESSMENT AND PLAN:  Discussed the following assessment and plan:  Visit for preventive health examination  Hypothyroidism, unspecified hypothyroidism type  Family  history of heart disease - brother and father  Disc risk assessment and  Prevention  Ex bp control lipids lsi  She could consider cac score to decide if statin med benefit poss.   Patient Care Team: Burnis Medin, MD as PCP - General Jola Baptist, DC (Chiropractic Medicine) Patient Instructions  Continue lifestyle intervention healthy eating and exercise . No change in meds .  Healthy lifestyle includes : At least 150 minutes of exercise weeks  , weight at healthy levels, which is usually   BMI 19-25.  Avoid trans fats and processed foods;  Increase fresh fruits and veges to 5 servings per day. And avoid sweet beverages including tea and juice. Mediterranean diet with olive oil and nuts have been noted to be heart and brain healthy . Avoid tobacco products . Limit  alcohol to  7 per week for women and 14 servings for men.  Get adequate sleep . Wear seat belts . Don't text and drive .   Consider getting coronary artery calcium score screen   To  fpor risk assessment  Consideration ofof adding a statin med etc . Make sure blood pressure still controlled    Mariann Laster K. Knolan Simien M.D.

## 2014-11-16 ENCOUNTER — Other Ambulatory Visit: Payer: Self-pay | Admitting: Internal Medicine

## 2014-11-16 NOTE — Telephone Encounter (Signed)
Sent to the pharmacy by e-scribe. 

## 2015-08-11 ENCOUNTER — Telehealth: Payer: Self-pay | Admitting: Family Medicine

## 2015-08-11 ENCOUNTER — Other Ambulatory Visit: Payer: Self-pay | Admitting: Internal Medicine

## 2015-08-11 ENCOUNTER — Other Ambulatory Visit: Payer: Self-pay | Admitting: Family Medicine

## 2015-08-11 DIAGNOSIS — Z0000 Encounter for general adult medical examination without abnormal findings: Secondary | ICD-10-CM

## 2015-08-11 NOTE — Telephone Encounter (Signed)
Pt has been sch

## 2015-08-11 NOTE — Telephone Encounter (Signed)
Sent to the pharmacy by e-scribe.  Pt due for cpx and lab work in Nov.  Message sent to scheduling.

## 2015-08-11 NOTE — Telephone Encounter (Signed)
Pt is due for lab work and cpx in Nov 2016.  I have placed the lab orders.  Please help her to make both appointments.  Thanks!

## 2015-08-15 ENCOUNTER — Ambulatory Visit: Payer: BC Managed Care – PPO

## 2015-09-04 ENCOUNTER — Encounter: Payer: Self-pay | Admitting: Internal Medicine

## 2015-09-04 ENCOUNTER — Ambulatory Visit (INDEPENDENT_AMBULATORY_CARE_PROVIDER_SITE_OTHER): Payer: BC Managed Care – PPO | Admitting: Internal Medicine

## 2015-09-04 VITALS — BP 118/70 | Temp 98.3°F | Wt 169.6 lb

## 2015-09-04 DIAGNOSIS — M1611 Unilateral primary osteoarthritis, right hip: Secondary | ICD-10-CM

## 2015-09-04 DIAGNOSIS — M1990 Unspecified osteoarthritis, unspecified site: Secondary | ICD-10-CM | POA: Diagnosis not present

## 2015-09-04 DIAGNOSIS — Z01818 Encounter for other preprocedural examination: Secondary | ICD-10-CM

## 2015-09-04 DIAGNOSIS — Z23 Encounter for immunization: Secondary | ICD-10-CM

## 2015-09-04 NOTE — Patient Instructions (Addendum)
  Will send communication to your surgeon office    Medical ok to proceed with surgery  Right total hip replacement  as planned .

## 2015-09-04 NOTE — Progress Notes (Signed)
Pre visit review using our clinic review tool, if applicable. No additional management support is needed unless otherwise documented below in the visit note.  Chief Complaint  Patient presents with  . Medical Clearance    right hip replacment     HPI: Denise Garrett 64 y.o. comes in for preoperative medical evaluation for  [facility]  Right referred in by orthopedic office: Surgery to be performed by Dr Alvan Dame. She has been having increasing pain 24/ 7 throbbing at night and difficulty walking without trauma. The plan is to do right hip surgery arthroplasty after medical clearance. She has no history of uncontrolled hypertension chest pain shortness of breath acute neurologic symptoms falling or unusual bleeding. She's taking tramadol at bedtime as needed for pain. She is on thyroid replacement without difficulty. Denies any sleep apnea but does have some snoring.  ROS: See pertinent positives and negatives per HPI. No history of recent skin infections systemic fever weight loss. Has taken a rare Ambien for sleep but not recently.  Past Medical History  Diagnosis Date  . Allergy   . Anemia   . Thyroid disease     hypothyroidism  . Hx of colonic polyps   . Allergic rhinitis   . Trigger finger     Family History  Problem Relation Age of Onset  . Heart attack Father   . Coronary artery disease Father   . Coronary artery disease Brother     quad bypass age 40  . Colon cancer Neg Hx   . Esophageal cancer Neg Hx   . Prostate cancer Neg Hx   . Rectal cancer Neg Hx     Social History   Social History  . Marital Status: Married    Spouse Name: N/A  . Number of Children: 1  . Years of Education: N/A   Occupational History  .  [employer]   Social History Main Topics  . Smoking status: Never Smoker   . Smokeless tobacco: Never Used  . Alcohol Use: 4.2 oz/week    7 Glasses of wine per week     Comment: glass of wine every evening  . Drug Use: No  . Sexual Activity: Not Asked    Other Topics Concern  . 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   Considering  retire ing in a couple years.   [place]  With late night noise      EXAM:  BP 118/70 mmHg  Temp(Src) 98.3 F (36.8 C) (Oral)  Wt 169 lb 9.6 oz (76.93 kg)  Body mass index is 28.22 kg/(m^2). Physical Exam: Vital signs reviewed VOZ:DGUY is a well-developed well-nourished alert cooperative  female who appears her stated age in no acute distress.  HEENT: normocephalic atraumatic , Eyes: PERRL EOM's full, conjunctiva clear, Nares: paten,t no deformity discharge or tenderness., Ears: no deformity EAC's clear TMs with normal landmarks. Mouth: clear OP, no lesions, edema.  Moist mucous membranes. Dentition in adequate repair. NECK: supple without masses, thyromegaly or bruits. CHEST/PULM:  Clear to auscultation and percussion breath sounds equal no wheeze , rales or rhonchi. CV: PMI is nondisplaced, S1 S2 no gallops, murmurs, rubs. Peripheral pulses are full without delay.No JVD .  ABDOMEN: Bowel sounds normal nontender  No guard or rebound, no hepato splenomegal no CVA tenderness.   Extremtities:  No clubbing cyanosis or edema, no acute joint swelling or redness no focal atrophy walks dependent on cane with significant limp and antalgia NEURO:  Oriented x3, cranial nerves 3-12 appear to be intact, no obvious focal weakness,gait  antalgic limits no abnormal reflexes or asymmetrical SKIN: No acute rashes normal turgor, color, no bruising or petechiae. PSYCH: Oriented, good eye contact, no obvious depression anxiety, cognition and judgment appear normal. LN: no cervical l adenopathy     ASSESSMENT AND PLAN:  Discussed the following assessment and plan:  Arthritis of right hip - Causing significant disability and pain, evaluate  medically for THA  Pre-operative clearance  Need for prophylactic vaccination and inoculation against influenza - Plan: Flu Vaccine QUAD 36+ mos PF IM (Fluarix & Fluzone Quad PF) Patient is an acceptable risk to proceed with elective surgery. She is having significant pain and disability from her predicament. Will send letter to operating surgeon. -Patient advised to return or notify health care team  if symptoms worsen ,persist or new concerns arise.  Patient Instructions   Will send communication to your surgeon office    Medical ok to proceed with surgery  Right total hip replacement  as planned .     Standley Brooking. Panosh M.D.    Medication List       This list is accurate as of: 09/04/15  6:10 PM.  Always use your most recent med list.               cholecalciferol 1000 UNITS tablet  Commonly known as:  VITAMIN D  Take 1,000 Units by mouth daily.     levothyroxine 88 MCG tablet  Commonly known as:  SYNTHROID, LEVOTHROID  TAKE 1 TABLET DAILY     Na Sulfate-K Sulfate-Mg Sulf Soln  Commonly known as:  SUPREP BOWEL PREP  Take 1 kit by mouth once.     NON FORMULARY  tumeric     traMADol 50 MG tablet  Commonly known as:  ULTRAM  Take 50 mg by mouth 3 (three) times daily as needed.

## 2015-09-28 NOTE — H&P (Signed)
TOTAL HIP ADMISSION H&P  Patient is admitted for right total hip arthroplasty, anterior approach.  Subjective:  Chief Complaint:      Right hip primary OA / pain  HPI: Denise Garrett, 64 y.o. female, has a history of pain and functional disability in the right hip(s) due to arthritis and patient has failed non-surgical conservative treatments for greater than 12 weeks to include NSAID's and/or analgesics, corticosteriod injections, use of assistive devices and activity modification.  Onset of symptoms was gradual starting  years ago with rapidlly worsening course since that time.The patient noted no past surgery on the right hip(s).  Patient currently rates pain in the right hip at 9 out of 10 with activity. Patient has night pain, worsening of pain with activity and weight bearing, trendelenberg gait, pain that interfers with activities of daily living and pain with passive range of motion. Patient has evidence of periarticular osteophytes and joint space narrowing by imaging studies. This condition presents safety issues increasing the risk of falls.   There is no current active infection.  Risks, benefits and expectations were discussed with the patient.  Risks including but not limited to the risk of anesthesia, blood clots, nerve damage, blood vessel damage, failure of the prosthesis, infection and up to and including death.  Patient understand the risks, benefits and expectations and wishes to proceed with surgery.   PCP: Lottie Dawson, MD  D/C Plans:      Home with HHPT  Post-op Meds:       No Rx given  Tranexamic Acid:      To be given - IV  Decadron:      Is to be given  FYI:     ASA post-op  Tramadol and APAP post-op    Patient Active Problem List   Diagnosis Date Noted  . Family history of heart disease 10/26/2014  . Groin swelling 12/03/2013  . Lt groin pain 12/03/2013  . Abnormal LFTs 10/07/2013  . Hair loss 11/09/2012  . Sleep disturbance 11/09/2012  . Abdominal  pain, other specified site 11/09/2012  . Constipation 07/04/2012  . Visit for preventive health examination 02/14/2012  . Routine gynecological examination 02/14/2012  . Hypothyroidism 11/03/2007  . ALLERGIC RHINITIS 11/03/2007  . ARTHRITIS 11/03/2007  . COLONIC POLYPS, HX OF 11/03/2007   Past Medical History  Diagnosis Date  . Allergy   . Anemia   . Thyroid disease     hypothyroidism  . Hx of colonic polyps   . Allergic rhinitis   . Trigger finger     Past Surgical History  Procedure Laterality Date  . Exploratory laparotomy    . Bunion removed    . Polypectomy    . Colonoscopy    . Wisdom tooth extraction      No prescriptions prior to admission   Allergies  Allergen Reactions  . Codeine Nausea And Vomiting    Social History  Substance Use Topics  . Smoking status: Never Smoker   . Smokeless tobacco: Never Used  . Alcohol Use: 4.2 oz/week    7 Glasses of wine per week     Comment: glass of wine every evening    Family History  Problem Relation Age of Onset  . Heart attack Father   . Coronary artery disease Father   . Coronary artery disease Brother     quad bypass age 56  . Colon cancer Neg Hx   . Esophageal cancer Neg Hx   . Prostate cancer Neg Hx   .  Rectal cancer Neg Hx      Review of Systems  Constitutional: Negative.   HENT: Negative.   Eyes: Negative.   Respiratory: Negative.   Cardiovascular: Negative.   Gastrointestinal: Negative.   Genitourinary: Negative.   Musculoskeletal: Positive for joint pain.  Skin: Negative.   Neurological: Negative.   Endo/Heme/Allergies: Positive for environmental allergies.  Psychiatric/Behavioral: Negative.     Objective:  Physical Exam  Constitutional: She is oriented to person, place, and time. She appears well-developed.  HENT:  Head: Normocephalic.  Eyes: Pupils are equal, round, and reactive to light.  Neck: Neck supple. No JVD present. No tracheal deviation present. No thyromegaly present.   Cardiovascular: Normal rate, regular rhythm, normal heart sounds and intact distal pulses.   Respiratory: Effort normal and breath sounds normal. No stridor. No respiratory distress. She has no wheezes.  GI: Soft. There is no tenderness. There is no guarding.  Musculoskeletal:       Left hip: She exhibits decreased range of motion, decreased strength, tenderness and bony tenderness. She exhibits no swelling, no deformity and no laceration.  Lymphadenopathy:    She has no cervical adenopathy.  Neurological: She is alert and oriented to person, place, and time.  Skin: Skin is warm and dry.  Psychiatric: She has a normal mood and affect.     Labs:  Estimated body mass index is 28.22 kg/(m^2) as calculated from the following:   Height as of 10/26/14: 5\' 5"  (1.651 m).   Weight as of 09/04/15: 76.93 kg (169 lb 9.6 oz).   Imaging Review Plain radiographs demonstrate severe degenerative joint disease of the right hip(s). The bone quality appears to be good for age and reported activity level.  Assessment/Plan:  End stage arthritis, right hip(s)  The patient history, physical examination, clinical judgement of the provider and imaging studies are consistent with end stage degenerative joint disease of the right hip(s) and total hip arthroplasty is deemed medically necessary. The treatment options including medical management, injection therapy, arthroscopy and arthroplasty were discussed at length. The risks and benefits of total hip arthroplasty were presented and reviewed. The risks due to aseptic loosening, infection, stiffness, dislocation/subluxation,  thromboembolic complications and other imponderables were discussed.  The patient acknowledged the explanation, agreed to proceed with the plan and consent was signed. Patient is being admitted for inpatient treatment for surgery, pain control, PT, OT, prophylactic antibiotics, VTE prophylaxis, progressive ambulation and ADL's and discharge  planning.The patient is planning to be discharged home with home health services.     West Pugh Owens Hara   PA-C  09/28/2015, 2:16 PM

## 2015-09-29 ENCOUNTER — Encounter (HOSPITAL_COMMUNITY): Payer: Self-pay

## 2015-09-29 ENCOUNTER — Encounter (HOSPITAL_COMMUNITY)
Admission: RE | Admit: 2015-09-29 | Discharge: 2015-09-29 | Disposition: A | Discharge status: Home / Self Care | Payer: BC Managed Care – PPO | Source: Ambulatory Visit | Attending: Orthopedic Surgery | Admitting: Orthopedic Surgery

## 2015-09-29 DIAGNOSIS — Z01818 Encounter for other preprocedural examination: Secondary | ICD-10-CM | POA: Diagnosis not present

## 2015-09-29 DIAGNOSIS — M1611 Unilateral primary osteoarthritis, right hip: Secondary | ICD-10-CM | POA: Insufficient documentation

## 2015-09-29 HISTORY — DX: Hypothyroidism, unspecified: E03.9

## 2015-09-29 HISTORY — DX: Unspecified osteoarthritis, unspecified site: M19.90

## 2015-09-29 LAB — CBC
HCT: 42.9 % (ref 36.0–46.0)
HEMOGLOBIN: 14.4 g/dL (ref 12.0–15.0)
MCH: 29.4 pg (ref 26.0–34.0)
MCHC: 33.6 g/dL (ref 30.0–36.0)
MCV: 87.6 fL (ref 78.0–100.0)
Platelets: 282 10*3/uL (ref 150–400)
RBC: 4.9 MIL/uL (ref 3.87–5.11)
RDW: 12.9 % (ref 11.5–15.5)
WBC: 6.9 10*3/uL (ref 4.0–10.5)

## 2015-09-29 LAB — URINALYSIS, ROUTINE W REFLEX MICROSCOPIC
BILIRUBIN URINE: NEGATIVE
Glucose, UA: NEGATIVE mg/dL
Ketones, ur: NEGATIVE mg/dL
LEUKOCYTES UA: NEGATIVE
NITRITE: NEGATIVE
PH: 6 (ref 5.0–8.0)
Protein, ur: NEGATIVE mg/dL
SPECIFIC GRAVITY, URINE: 1.008 (ref 1.005–1.030)
Urobilinogen, UA: 0.2 mg/dL (ref 0.0–1.0)

## 2015-09-29 LAB — PROTIME-INR
INR: 1.01 (ref 0.00–1.49)
PROTHROMBIN TIME: 13.5 s (ref 11.6–15.2)

## 2015-09-29 LAB — BASIC METABOLIC PANEL
ANION GAP: 8 (ref 5–15)
BUN: 15 mg/dL (ref 6–20)
CALCIUM: 9.9 mg/dL (ref 8.9–10.3)
CO2: 28 mmol/L (ref 22–32)
Chloride: 101 mmol/L (ref 101–111)
Creatinine, Ser: 0.85 mg/dL (ref 0.44–1.00)
GLUCOSE: 95 mg/dL (ref 65–99)
Potassium: 4.5 mmol/L (ref 3.5–5.1)
SODIUM: 137 mmol/L (ref 135–145)

## 2015-09-29 LAB — TYPE AND SCREEN
ABO/RH(D): A POS
Antibody Screen: NEGATIVE

## 2015-09-29 LAB — URINE MICROSCOPIC-ADD ON

## 2015-09-29 LAB — APTT: APTT: 29 s (ref 24–37)

## 2015-09-29 LAB — ABO/RH: ABO/RH(D): A POS

## 2015-09-29 LAB — SURGICAL PCR SCREEN
MRSA, PCR: NEGATIVE
STAPHYLOCOCCUS AUREUS: POSITIVE — AB

## 2015-09-29 NOTE — Progress Notes (Signed)
Rx Mupuricin called to [facility] @ [place] Pt notified

## 2015-09-29 NOTE — Patient Instructions (Addendum)
YOUR PROCEDURE IS SCHEDULED ON :  10/10/15  REPORT TO [facility] MAIN ENTRANCE FOLLOW SIGNS TO EAST ELEVATOR - GO TO 3rd FLOOR CHECK IN AT 3 EAST NURSES STATION (SHORT STAY) AT:  8:30 AM  CALL THIS NUMBER IF YOU HAVE PROBLEMS THE MORNING OF SURGERY [phone]  REMEMBER:ONLY 1 PER PERSON MAY GO TO SHORT STAY WITH YOU TO GET READY THE MORNING OF YOUR SURGERY  DO NOT EAT FOOD OR DRINK LIQUIDS AFTER MIDNIGHT  TAKE THESE MEDICINES THE MORNING OF SURGERY: SYNTHROID  YOU MAY NOT HAVE ANY METAL ON YOUR BODY INCLUDING HAIR PINS AND PIERCING'S. DO NOT WEAR JEWELRY, MAKEUP, LOTIONS, POWDERS OR PERFUMES. DO NOT WEAR NAIL POLISH. DO NOT SHAVE 48 HRS PRIOR TO SURGERY. MEN MAY SHAVE FACE AND NECK.  DO NOT [place]. Denise Garrett IS NOT RESPONSIBLE FOR VALUABLES.  CONTACTS, DENTURES OR PARTIALS MAY NOT BE WORN TO SURGERY. LEAVE SUITCASE IN CAR. CAN BE BROUGHT TO ROOM AFTER SURGERY.  PATIENTS DISCHARGED THE DAY OF SURGERY WILL NOT BE ALLOWED TO DRIVE HOME.  PLEASE READ OVER THE FOLLOWING INSTRUCTION SHEETS _________________________________________________________________________________                                          [facility] - PREPARING FOR SURGERY  Before surgery, you can play an important role.  Because skin is not sterile, your skin needs to be as free of germs as possible.  You can reduce the number of germs on your skin by washing with CHG (chlorahexidine gluconate) soap before surgery.  CHG is an antiseptic cleaner which kills germs and bonds with the skin to continue killing germs even after washing. Please DO NOT use if you have an allergy to CHG or antibacterial soaps.  If your skin becomes reddened/irritated stop using the CHG and inform your nurse when you arrive at Short Stay. Do not shave (including legs and underarms) for at least 48 hours prior to the first CHG shower.  You may shave your face. Please follow these instructions  carefully:   1.  Shower with CHG Soap the night before surgery and the  morning of Surgery.   2.  If you choose to wash your hair, wash your hair first as usual with your  normal  Shampoo.   3.  After you shampoo, rinse your hair and body thoroughly to remove the  shampoo.                                         4.  Use CHG as you would any other liquid soap.  You can apply chg directly  to the skin and wash . Gently wash with scrungie or clean wascloth    5.  Apply the CHG Soap to your body ONLY FROM THE NECK DOWN.   Do not use on open                           Wound or open sores. Avoid contact with eyes, ears mouth and genitals (private parts).                        Genitals (private parts) with your normal soap.  6.  Wash thoroughly, paying special attention to the area where your surgery  will be performed.   7.  Thoroughly rinse your body with warm water from the neck down.   8.  DO NOT shower/wash with your normal soap after using and rinsing off  the CHG Soap .                9.  Pat yourself dry with a clean towel.             10.  Wear clean night clothes to bed after shower             11.  Place clean sheets on your bed the night of your first shower and do not  sleep with pets.  Day of Surgery : Do not apply any lotions/deodorants the morning of surgery.  Please wear clean clothes to the hospital/surgery center.  FAILURE TO FOLLOW THESE INSTRUCTIONS MAY RESULT IN THE CANCELLATION OF YOUR SURGERY    PATIENT SIGNATURE_________________________________  ______________________________________________________________________     Denise Garrett  An incentive spirometer is a tool that can help keep your lungs clear and active. This tool measures how well you are filling your lungs with each breath. Taking long deep breaths may help reverse or decrease the chance of developing breathing (pulmonary) problems (especially infection) following:  A long  period of time when you are unable to move or be active. BEFORE THE PROCEDURE   If the spirometer includes an indicator to show your best effort, your nurse or respiratory therapist will set it to a desired goal.  If possible, sit up straight or lean slightly forward. Try not to slouch.  Hold the incentive spirometer in an upright position. INSTRUCTIONS FOR USE   Sit on the edge of your bed if possible, or sit up as far as you can in bed or on a chair.  Hold the incentive spirometer in an upright position.  Breathe out normally.  Place the mouthpiece in your mouth and seal your lips tightly around it.  Breathe in slowly and as deeply as possible, raising the piston or the ball toward the top of the column.  Hold your breath for 3-5 seconds or for as long as possible. Allow the piston or ball to fall to the bottom of the column.  Remove the mouthpiece from your mouth and breathe out normally.  Rest for a few seconds and repeat Steps 1 through 7 at least 10 times every 1-2 hours when you are awake. Take your time and take a few normal breaths between deep breaths.  The spirometer may include an indicator to show your best effort. Use the indicator as a goal to work toward during each repetition.  After each set of 10 deep breaths, practice coughing to be sure your lungs are clear. If you have an incision (the cut made at the time of surgery), support your incision when coughing by placing a pillow or rolled up towels firmly against it. Once you are able to get out of bed, walk around indoors and cough well. You may stop using the incentive spirometer when instructed by your caregiver.  RISKS AND COMPLICATIONS  Take your time so you do not get dizzy or light-headed.  If you are in pain, you may need to take or ask for pain medication before doing incentive spirometry. It is harder to take a deep breath if you are having pain. AFTER USE  Rest and breathe slowly and easily.  It can  be helpful to keep track of a log of your progress. Your caregiver can provide you with a simple table to help with this. If you are using the spirometer at home, follow these instructions: Denise Garrett IF:   You are having difficultly using the spirometer.  You have trouble using the spirometer as often as instructed.  Your pain medication is not giving enough relief while using the spirometer.  You develop fever of 100.5 F (38.1 C) or higher. SEEK IMMEDIATE MEDICAL CARE IF:   You cough up bloody sputum that had not been present before.  You develop fever of 102 F (38.9 C) or greater.  You develop worsening pain at or near the incision site. MAKE SURE YOU:   Understand these instructions.  Will watch your condition.  Will get help right away if you are not doing well or get worse. Document Released: 03/31/2007 Document Revised: 02/10/2012 Document Reviewed: 06/01/2007 [employer] Patient Information 2014 [employer], [place].   ________________________________________________________________________  WHAT IS A BLOOD TRANSFUSION? Blood Transfusion Information  A transfusion is the replacement of blood or some of its parts. Blood is made up of multiple cells which provide different functions.  Red blood cells carry oxygen and are used for blood loss replacement.  White blood cells fight against infection.  Platelets control bleeding.  Plasma helps clot blood.  Other blood products are available for specialized needs, such as hemophilia or other clotting disorders. BEFORE THE TRANSFUSION  Who gives blood for transfusions?   Healthy volunteers who are fully evaluated to make sure their blood is safe. This is blood bank blood. Transfusion therapy is the safest it has ever been in the practice of medicine. Before blood is taken from a donor, a complete history is taken to make sure that person has no history of diseases nor engages in risky social behavior (examples are  intravenous drug use or sexual activity with multiple partners). The donor's travel history is screened to minimize risk of transmitting infections, such as malaria. The donated blood is tested for signs of infectious diseases, such as HIV and hepatitis. The blood is then tested to be sure it is compatible with you in order to minimize the chance of a transfusion reaction. If you or a relative donates blood, this is often done in anticipation of surgery and is not appropriate for emergency situations. It takes many days to process the donated blood. RISKS AND COMPLICATIONS Although transfusion therapy is very safe and saves many lives, the main dangers of transfusion include:   Getting an infectious disease.  Developing a transfusion reaction. This is an allergic reaction to something in the blood you were given. Every precaution is taken to prevent this. The decision to have a blood transfusion has been considered carefully by your caregiver before blood is given. Blood is not given unless the benefits outweigh the risks. AFTER THE TRANSFUSION  Right after receiving a blood transfusion, you will usually feel much better and more energetic. This is especially true if your red blood cells have gotten low (anemic). The transfusion raises the level of the red blood cells which carry oxygen, and this usually causes an energy increase.  The nurse administering the transfusion will monitor you carefully for complications. HOME CARE INSTRUCTIONS  No special instructions are needed after a transfusion. You may find your energy is better. Speak with your caregiver about any limitations on activity for underlying diseases you may have. SEEK MEDICAL CARE IF:   Your  condition is not improving after your transfusion.  You develop redness or irritation at the intravenous (IV) site. SEEK IMMEDIATE MEDICAL CARE IF:  Any of the following symptoms occur over the next 12 hours:  Shaking chills.  You have a  temperature by mouth above 102 F (38.9 C), not controlled by medicine.  Chest, back, or muscle pain.  People around you feel you are not acting correctly or are confused.  Shortness of breath or difficulty breathing.  Dizziness and fainting.  You get a rash or develop hives.  You have a decrease in urine output.  Your urine turns a dark color or changes to pink, red, or brown. Any of the following symptoms occur over the next 10 days:  You have a temperature by mouth above 102 F (38.9 C), not controlled by medicine.  Shortness of breath.  Weakness after normal activity.  The white part of the eye turns yellow (jaundice).  You have a decrease in the amount of urine or are urinating less often.  Your urine turns a dark color or changes to pink, red, or brown. Document Released: 11/15/2000 Document Revised: 02/10/2012 Document Reviewed: 07/04/2008 [facility] Patient Information 2014 [place], [place].  _______________________________________________________________________

## 2015-10-10 ENCOUNTER — Inpatient Hospital Stay (HOSPITAL_COMMUNITY): Payer: BC Managed Care – PPO

## 2015-10-10 ENCOUNTER — Encounter (HOSPITAL_COMMUNITY)
Admission: AD | Disposition: A | Discharge status: Home Health | Payer: Self-pay | Source: Ambulatory Visit | Attending: Orthopedic Surgery

## 2015-10-10 ENCOUNTER — Inpatient Hospital Stay (HOSPITAL_COMMUNITY): Payer: BC Managed Care – PPO | Admitting: Certified Registered Nurse Anesthetist

## 2015-10-10 ENCOUNTER — Inpatient Hospital Stay (HOSPITAL_COMMUNITY)
Admission: AD | Admit: 2015-10-10 | Discharge: 2015-10-11 | DRG: 470 | Disposition: A | Discharge status: Home Health | Payer: BC Managed Care – PPO | Source: Ambulatory Visit | Attending: Orthopedic Surgery | Admitting: Orthopedic Surgery

## 2015-10-10 ENCOUNTER — Encounter (HOSPITAL_COMMUNITY): Payer: Self-pay | Admitting: *Deleted

## 2015-10-10 DIAGNOSIS — E039 Hypothyroidism, unspecified: Secondary | ICD-10-CM | POA: Diagnosis present

## 2015-10-10 DIAGNOSIS — Z6827 Body mass index (BMI) 27.0-27.9, adult: Secondary | ICD-10-CM

## 2015-10-10 DIAGNOSIS — M25551 Pain in right hip: Secondary | ICD-10-CM | POA: Diagnosis present

## 2015-10-10 DIAGNOSIS — M1611 Unilateral primary osteoarthritis, right hip: Secondary | ICD-10-CM | POA: Diagnosis present

## 2015-10-10 DIAGNOSIS — Z01812 Encounter for preprocedural laboratory examination: Secondary | ICD-10-CM

## 2015-10-10 DIAGNOSIS — E663 Overweight: Secondary | ICD-10-CM | POA: Diagnosis present

## 2015-10-10 DIAGNOSIS — Z96649 Presence of unspecified artificial hip joint: Secondary | ICD-10-CM

## 2015-10-10 DIAGNOSIS — Z419 Encounter for procedure for purposes other than remedying health state, unspecified: Secondary | ICD-10-CM

## 2015-10-10 HISTORY — PX: TOTAL HIP ARTHROPLASTY: SHX124

## 2015-10-10 SURGERY — ARTHROPLASTY, HIP, TOTAL, ANTERIOR APPROACH
Anesthesia: Spinal | Site: Hip | Laterality: Right

## 2015-10-10 MED ORDER — BUPIVACAINE HCL (PF) 0.5 % IJ SOLN
INTRAMUSCULAR | Status: AC
  Filled 2015-10-10: qty 30

## 2015-10-10 MED ORDER — MAGNESIUM CITRATE PO SOLN: 1.0000 | Freq: Once | ORAL | Status: DC | PRN

## 2015-10-10 MED ORDER — PROPOFOL 10 MG/ML IV BOLUS
INTRAVENOUS | Status: AC
  Filled 2015-10-10: qty 20

## 2015-10-10 MED ORDER — METOCLOPRAMIDE HCL 5 MG/ML IJ SOLN: 5.0000 mg | Freq: Three times a day (TID) | INTRAMUSCULAR | Status: DC | PRN

## 2015-10-10 MED ORDER — ALUM & MAG HYDROXIDE-SIMETH 200-200-20 MG/5ML PO SUSP: 30.0000 mL | ORAL | Status: DC | PRN

## 2015-10-10 MED ORDER — ACETAMINOPHEN 500 MG PO TABS
1000.0000 mg | ORAL_TABLET | Freq: Three times a day (TID) | ORAL | Status: DC
  Administered 2015-10-10 – 2015-10-11 (×3): 1000 mg via ORAL
  Filled 2015-10-10 (×6): qty 2

## 2015-10-10 MED ORDER — MORPHINE SULFATE (PF) 2 MG/ML IV SOLN
1.0000 mg | INTRAVENOUS | Status: DC | PRN
  Administered 2015-10-11: 2 mg via INTRAVENOUS
  Filled 2015-10-10: qty 1

## 2015-10-10 MED ORDER — DEXAMETHASONE SODIUM PHOSPHATE 10 MG/ML IJ SOLN
INTRAMUSCULAR | Status: AC
  Filled 2015-10-10: qty 1

## 2015-10-10 MED ORDER — BISACODYL 10 MG RE SUPP: 10.0000 mg | Freq: Every day | RECTAL | Status: DC | PRN

## 2015-10-10 MED ORDER — ONDANSETRON HCL 4 MG/2ML IJ SOLN: 4.0000 mg | Freq: Once | INTRAMUSCULAR | Status: DC | PRN

## 2015-10-10 MED ORDER — DEXAMETHASONE SODIUM PHOSPHATE 10 MG/ML IJ SOLN
10.0000 mg | Freq: Once | INTRAMUSCULAR | Status: DC
  Filled 2015-10-10: qty 1

## 2015-10-10 MED ORDER — SODIUM CHLORIDE 0.9 % IR SOLN
Status: DC | PRN
  Administered 2015-10-10: 1000 mL

## 2015-10-10 MED ORDER — EPHEDRINE SULFATE 50 MG/ML IJ SOLN
INTRAMUSCULAR | Status: AC
  Filled 2015-10-10: qty 1

## 2015-10-10 MED ORDER — MIDAZOLAM HCL 2 MG/2ML IJ SOLN
INTRAMUSCULAR | Status: AC
  Filled 2015-10-10: qty 4

## 2015-10-10 MED ORDER — CELECOXIB 200 MG PO CAPS
200.0000 mg | ORAL_CAPSULE | Freq: Two times a day (BID) | ORAL | Status: DC
  Administered 2015-10-10: 200 mg via ORAL
  Filled 2015-10-10 (×3): qty 1

## 2015-10-10 MED ORDER — ASPIRIN EC 325 MG PO TBEC
325.0000 mg | DELAYED_RELEASE_TABLET | Freq: Two times a day (BID) | ORAL | Status: DC
  Administered 2015-10-11: 325 mg via ORAL
  Filled 2015-10-10 (×3): qty 1

## 2015-10-10 MED ORDER — METHOCARBAMOL 500 MG PO TABS
500.0000 mg | ORAL_TABLET | Freq: Four times a day (QID) | ORAL | Status: DC | PRN
  Administered 2015-10-10 – 2015-10-11 (×3): 500 mg via ORAL
  Filled 2015-10-10 (×3): qty 1

## 2015-10-10 MED ORDER — FERROUS SULFATE 325 (65 FE) MG PO TABS
325.0000 mg | ORAL_TABLET | Freq: Three times a day (TID) | ORAL | Status: DC
  Filled 2015-10-10 (×4): qty 1

## 2015-10-10 MED ORDER — ONDANSETRON HCL 4 MG/2ML IJ SOLN
INTRAMUSCULAR | Status: AC
  Filled 2015-10-10: qty 2

## 2015-10-10 MED ORDER — PHENOL 1.4 % MT LIQD
1.0000 | OROMUCOSAL | Status: DC | PRN
  Filled 2015-10-10: qty 177

## 2015-10-10 MED ORDER — CEFAZOLIN SODIUM-DEXTROSE 2-3 GM-% IV SOLR
2.0000 g | INTRAVENOUS | Status: AC
  Administered 2015-10-10: 2 g via INTRAVENOUS

## 2015-10-10 MED ORDER — DIPHENHYDRAMINE HCL 25 MG PO CAPS: 25.0000 mg | ORAL_CAPSULE | Freq: Four times a day (QID) | ORAL | Status: DC | PRN

## 2015-10-10 MED ORDER — LACTATED RINGERS IV SOLN
INTRAVENOUS | Status: DC
  Administered 2015-10-10: 1000 mL via INTRAVENOUS
  Administered 2015-10-10 (×2): via INTRAVENOUS

## 2015-10-10 MED ORDER — FENTANYL CITRATE (PF) 100 MCG/2ML IJ SOLN
25.0000 ug | INTRAMUSCULAR | Status: DC | PRN
  Administered 2015-10-10: 50 ug via INTRAVENOUS

## 2015-10-10 MED ORDER — ONDANSETRON HCL 4 MG PO TABS: 4.0000 mg | ORAL_TABLET | Freq: Four times a day (QID) | ORAL | Status: DC | PRN

## 2015-10-10 MED ORDER — CEFAZOLIN SODIUM-DEXTROSE 2-3 GM-% IV SOLR
INTRAVENOUS | Status: AC
  Filled 2015-10-10: qty 50

## 2015-10-10 MED ORDER — FENTANYL CITRATE (PF) 100 MCG/2ML IJ SOLN
INTRAMUSCULAR | Status: AC
  Filled 2015-10-10: qty 4

## 2015-10-10 MED ORDER — MIDAZOLAM HCL 5 MG/5ML IJ SOLN
INTRAMUSCULAR | Status: DC | PRN
  Administered 2015-10-10: 2 mg via INTRAVENOUS

## 2015-10-10 MED ORDER — PROPOFOL 500 MG/50ML IV EMUL
INTRAVENOUS | Status: DC | PRN
  Administered 2015-10-10: 100 ug/kg/min via INTRAVENOUS

## 2015-10-10 MED ORDER — MENTHOL 3 MG MT LOZG: 1.0000 | LOZENGE | OROMUCOSAL | Status: DC | PRN

## 2015-10-10 MED ORDER — SODIUM CHLORIDE 0.9 % IJ SOLN
INTRAMUSCULAR | Status: AC
  Filled 2015-10-10: qty 10

## 2015-10-10 MED ORDER — DEXAMETHASONE SODIUM PHOSPHATE 10 MG/ML IJ SOLN
10.0000 mg | Freq: Once | INTRAMUSCULAR | Status: AC
  Administered 2015-10-10: 10 mg via INTRAVENOUS

## 2015-10-10 MED ORDER — CHLORHEXIDINE GLUCONATE 4 % EX LIQD: 60.0000 mL | Freq: Once | CUTANEOUS | Status: DC

## 2015-10-10 MED ORDER — DOCUSATE SODIUM 100 MG PO CAPS
100.0000 mg | ORAL_CAPSULE | Freq: Two times a day (BID) | ORAL | Status: DC
  Administered 2015-10-10: 100 mg via ORAL

## 2015-10-10 MED ORDER — SODIUM CHLORIDE 0.9 % IV SOLN
100.0000 mL/h | INTRAVENOUS | Status: DC
  Administered 2015-10-10 – 2015-10-11 (×2): 100 mL/h via INTRAVENOUS
  Filled 2015-10-10 (×5): qty 1000

## 2015-10-10 MED ORDER — ONDANSETRON HCL 4 MG/2ML IJ SOLN
INTRAMUSCULAR | Status: DC | PRN
  Administered 2015-10-10: 4 mg via INTRAVENOUS

## 2015-10-10 MED ORDER — FENTANYL CITRATE (PF) 100 MCG/2ML IJ SOLN
INTRAMUSCULAR | Status: DC | PRN
  Administered 2015-10-10 (×2): 50 ug via INTRAVENOUS

## 2015-10-10 MED ORDER — METHOCARBAMOL 1000 MG/10ML IJ SOLN
500.0000 mg | Freq: Four times a day (QID) | INTRAVENOUS | Status: DC | PRN
  Administered 2015-10-10: 500 mg via INTRAVENOUS
  Filled 2015-10-10 (×2): qty 5

## 2015-10-10 MED ORDER — POLYETHYLENE GLYCOL 3350 17 G PO PACK
17.0000 g | PACK | Freq: Two times a day (BID) | ORAL | Status: DC
  Administered 2015-10-10: 17 g via ORAL

## 2015-10-10 MED ORDER — FENTANYL CITRATE (PF) 100 MCG/2ML IJ SOLN
INTRAMUSCULAR | Status: AC
  Filled 2015-10-10: qty 2

## 2015-10-10 MED ORDER — LEVOTHYROXINE SODIUM 88 MCG PO TABS
88.0000 ug | ORAL_TABLET | Freq: Every day | ORAL | Status: DC
  Administered 2015-10-11: 88 ug via ORAL
  Filled 2015-10-10 (×2): qty 1

## 2015-10-10 MED ORDER — MEPERIDINE HCL 50 MG/ML IJ SOLN: 6.2500 mg | INTRAMUSCULAR | Status: DC | PRN

## 2015-10-10 MED ORDER — METOCLOPRAMIDE HCL 10 MG PO TABS: 5.0000 mg | ORAL_TABLET | Freq: Three times a day (TID) | ORAL | Status: DC | PRN

## 2015-10-10 MED ORDER — SODIUM CHLORIDE 0.9 % IV SOLN
1000.0000 mg | Freq: Once | INTRAVENOUS | Status: AC
  Administered 2015-10-10: 1000 mg via INTRAVENOUS
  Filled 2015-10-10: qty 10

## 2015-10-10 MED ORDER — TRAMADOL HCL 50 MG PO TABS
50.0000 mg | ORAL_TABLET | Freq: Four times a day (QID) | ORAL | Status: DC
  Administered 2015-10-10 – 2015-10-11 (×4): 100 mg via ORAL
  Filled 2015-10-10 (×4): qty 2

## 2015-10-10 MED ORDER — CEFAZOLIN SODIUM-DEXTROSE 2-3 GM-% IV SOLR
2.0000 g | Freq: Four times a day (QID) | INTRAVENOUS | Status: AC
  Administered 2015-10-10 – 2015-10-11 (×2): 2 g via INTRAVENOUS
  Filled 2015-10-10 (×2): qty 50

## 2015-10-10 MED ORDER — BUPIVACAINE HCL (PF) 0.5 % IJ SOLN
INTRAMUSCULAR | Status: DC | PRN
  Administered 2015-10-10: 3 mL via INTRATHECAL

## 2015-10-10 MED ORDER — ONDANSETRON HCL 4 MG/2ML IJ SOLN
4.0000 mg | Freq: Four times a day (QID) | INTRAMUSCULAR | Status: DC | PRN
  Administered 2015-10-11: 4 mg via INTRAVENOUS
  Filled 2015-10-10: qty 2

## 2015-10-10 SURGICAL SUPPLY — 36 items
BAG DECANTER FOR FLEXI CONT (MISCELLANEOUS) IMPLANT
BAG SPEC THK2 15X12 ZIP CLS (MISCELLANEOUS)
BAG ZIPLOCK 12X15 (MISCELLANEOUS) IMPLANT
CAPT HIP TOTAL 2 ×2 IMPLANT
CLOTH BEACON ORANGE TIMEOUT ST (SAFETY) ×3 IMPLANT
COVER PERINEAL POST (MISCELLANEOUS) ×3 IMPLANT
DRAPE STERI IOBAN 125X83 (DRAPES) ×3 IMPLANT
DRAPE U-SHAPE 47X51 STRL (DRAPES) ×6 IMPLANT
DRSG AQUACEL AG ADV 3.5X10 (GAUZE/BANDAGES/DRESSINGS) ×3 IMPLANT
DURAPREP 26ML APPLICATOR (WOUND CARE) ×3 IMPLANT
ELECT REM PT RETURN 15FT ADLT (MISCELLANEOUS) IMPLANT
ELECT REM PT RETURN 9FT ADLT (ELECTROSURGICAL) ×3
ELECTRODE REM PT RTRN 9FT ADLT (ELECTROSURGICAL) ×1 IMPLANT
GLOVE BIOGEL M 7.0 STRL (GLOVE) IMPLANT
GLOVE BIOGEL M STRL SZ7.5 (GLOVE) ×2 IMPLANT
GLOVE BIOGEL PI IND STRL 7.5 (GLOVE) ×1 IMPLANT
GLOVE BIOGEL PI IND STRL 8.5 (GLOVE) ×1 IMPLANT
GLOVE BIOGEL PI INDICATOR 7.5 (GLOVE) ×4
GLOVE BIOGEL PI INDICATOR 8.5 (GLOVE)
GLOVE ECLIPSE 8.0 STRL XLNG CF (GLOVE) ×2 IMPLANT
GLOVE ORTHO TXT STRL SZ7.5 (GLOVE) ×3 IMPLANT
GOWN STRL REUS W/TWL LRG LVL3 (GOWN DISPOSABLE) ×3 IMPLANT
GOWN STRL REUS W/TWL XL LVL3 (GOWN DISPOSABLE) ×3 IMPLANT
HOLDER FOLEY CATH W/STRAP (MISCELLANEOUS) ×3 IMPLANT
LIQUID BAND (GAUZE/BANDAGES/DRESSINGS) ×3 IMPLANT
PACK ANTERIOR HIP CUSTOM (KITS) ×3 IMPLANT
SAW OSC TIP CART 19.5X105X1.3 (SAW) ×3 IMPLANT
SUT MNCRL AB 4-0 PS2 18 (SUTURE) ×3 IMPLANT
SUT VIC AB 1 CT1 36 (SUTURE) ×9 IMPLANT
SUT VIC AB 2-0 CT1 27 (SUTURE) ×9
SUT VIC AB 2-0 CT1 TAPERPNT 27 (SUTURE) ×2 IMPLANT
SUT VLOC 180 0 24IN GS25 (SUTURE) ×3 IMPLANT
SYR 50ML LL SCALE MARK (SYRINGE) IMPLANT
TRAY FOLEY W/METER SILVER 14FR (SET/KITS/TRAYS/PACK) ×3 IMPLANT
TRAY FOLEY W/METER SILVER 16FR (SET/KITS/TRAYS/PACK) ×1 IMPLANT
WATER STERILE IRR 1500ML POUR (IV SOLUTION) ×3 IMPLANT

## 2015-10-10 NOTE — Interval H&P Note (Signed)
History and Physical Interval Note:  10/10/2015 10:07 AM  Denise Garrett  has presented today for surgery, with the diagnosis of RIGHT HIP OA   The various methods of treatment have been discussed with the patient and family. After consideration of risks, benefits and other options for treatment, the patient has consented to  Procedure(s): TOTAL RIGHT HIP ARTHROPLASTY ANTERIOR APPROACH (Right) as a surgical intervention .  The patient's history has been reviewed, patient examined, no change in status, stable for surgery.  I have reviewed the patient's chart and labs.  Questions were answered to the patient's satisfaction.     Mauri Pole

## 2015-10-10 NOTE — Progress Notes (Signed)
X-ray results noted 

## 2015-10-10 NOTE — Op Note (Signed)
NAME:  Denise Garrett                ACCOUNT NO.: [id number]      MEDICAL RECORD NO.: [id number]      FACILITY:  [facility]      PHYSICIAN:  Paralee Cancel D  DATE OF BIRTH:  [birth year]     DATE OF PROCEDURE:  10/10/2015                                 OPERATIVE REPORT         PREOPERATIVE DIAGNOSIS: Right  hip osteoarthritis.      POSTOPERATIVE DIAGNOSIS:  Right hip osteoarthritis.      PROCEDURE:  Right total hip replacement through an anterior approach   utilizing DePuy THR system, component size 89mm pinnacle cup, a size 32+4 neutral   Altrex liner, a size 4 Hi Tri Lock stem with a 32+1 delta ceramic   ball.      SURGEON:  Pietro Cassis. Alvan Dame, M.D.      ASSISTANT:  Nehemiah Massed, PA-C     ANESTHESIA:  Spinal.      SPECIMENS:  None.      COMPLICATIONS:  None.      BLOOD LOSS:  600 cc     DRAINS:   None.      INDICATION OF THE PROCEDURE:  Denise Garrett is a 64 y.o. female who had   presented to office for evaluation of right hip pain.  Radiographs revealed   progressive degenerative changes with bone-on-bone   articulation to the  hip joint.  The patient had painful limited range of   motion significantly affecting their overall quality of life.  The patient was failing to    respond to conservative measures, and at this point was ready   to proceed with more definitive measures.  The patient has noted progressive   degenerative changes in his hip, progressive problems and dysfunction   with regarding the hip prior to surgery.  Consent was obtained for   benefit of pain relief.  Specific risk of infection, DVT, component   failure, dislocation, need for revision surgery, as well discussion of   the anterior versus posterior approach were reviewed.  Consent was   obtained for benefit of anterior pain relief through an anterior   approach.      PROCEDURE IN DETAIL:  The patient was brought to operative theater.   Once adequate anesthesia,  preoperative antibiotics, 2gm of Ancef, 1 gm of Tranexamic Acid, and 10 mg of Decadron administered.   The patient was positioned supine on the OSI Hanna table.  Once adequate   padding of boney process was carried out, we had predraped out the hip, and  used fluoroscopy to confirm orientation of the pelvis and position.      The right hip was then prepped and draped from proximal iliac crest to   mid thigh with shower curtain technique.      Time-out was performed identifying the patient, planned procedure, and   extremity.     An incision was then made 2 cm distal and lateral to the   anterior superior iliac spine extending over the orientation of the   tensor fascia lata muscle and sharp dissection was carried down to the   fascia of the muscle and protractor placed in the soft tissues.      The fascia  was then incised.  The muscle belly was identified and swept   laterally and retractor placed along the superior neck.  Following   cauterization of the circumflex vessels and removing some pericapsular   fat, a second cobra retractor was placed on the inferior neck.  A third   retractor was placed on the anterior acetabulum after elevating the   anterior rectus.  A L-capsulotomy was along the line of the   superior neck to the trochanteric fossa, then extended proximally and   distally.  Tag sutures were placed and the retractors were then placed   intracapsular.  We then identified the trochanteric fossa and   orientation of my neck cut, confirmed this radiographically   and then made a neck osteotomy with the femur on traction.  The femoral   head was removed without difficulty or complication.  Traction was let   off and retractors were placed posterior and anterior around the   acetabulum.      The labrum and foveal tissue were debrided.  I began reaming with a 57mm   reamer and reamed up to 49 mm reamer with good bony bed preparation and a 44mm   cup was chosen.  The final   59mm Pinnacle cup was then impacted under fluoroscopy  to confirm the depth of penetration and orientation with respect to   abduction.  A screw was placed followed by the hole eliminator.  The final   32+4 neutral Altrex liner was impacted with good visualized rim fit.  The cup was positioned anatomically within the acetabular portion of the pelvis.      At this point, the femur was rolled at 80 degrees.  Further capsule was   released off the inferior aspect of the femoral neck.  I then   released the superior capsule proximally.  The hook was placed laterally   along the femur and elevated manually and held in position with the bed   hook.  The leg was then extended and adducted with the leg rolled to 100   degrees of external rotation.  Once the proximal femur was fully   exposed, I used a box osteotome to set orientation.  I then began   broaching with the starting chili pepper broach and passed this by hand and then broached up to 4.  With the 4 broach in place I chose a high offset neck and did a trial reduction.  The offset was appropriate, leg lengths   appeared to be equal, confirmed radiographically.  Specially, comparing intra-operative fluoro shots with her pre-op pelvis.  Given these findings, I went ahead and dislocated the hip, repositioned all   retractors and positioned the right hip in the extended and abducted position.  The final 4 Hi Tri Lock stem was   chosen and it was impacted down to the level of neck cut.  Based on this   and the trial reduction, a 32+1 delta ceramic ball was chosen and   impacted onto a clean and dry trunnion, and the hip was reduced.  The   hip had been irrigated throughout the case again at this point.  I did   reapproximate the superior capsular leaflet to the anterior leaflet   using #1 Vicryl.  The fascia of the   tensor fascia lata muscle was then reapproximated using #1 Vicryl and #0 V-lock sutures.  The   remaining wound was closed with 2-0  Vicryl and running 4-0 Monocryl.   The  hip was cleaned, dried, and dressed sterilely using Dermabond and   Aquacel dressing.  She was then brought   to recovery room in stable condition tolerating the procedure well.    Nehemiah Massed, PA-C was present for the entirety of the case involved from   preoperative positioning, perioperative retractor management, general   facilitation of the case, as well as primary wound closure as assistant.            Pietro Cassis Alvan Dame, M.D.        10/10/2015 12:56 PM

## 2015-10-10 NOTE — Anesthesia Preprocedure Evaluation (Signed)
Anesthesia Evaluation  Patient identified by MRN, date of birth, ID band Patient awake    Reviewed: Allergy & Precautions, NPO status , Patient's Chart, lab work & pertinent test results  Airway Mallampati: I  TM Distance: >3 FB Neck ROM: Full    Dental   Pulmonary    Pulmonary exam normal       Cardiovascular Normal cardiovascular exam    Neuro/Psych    GI/Hepatic   Endo/Other    Renal/GU      Musculoskeletal   Abdominal   Peds  Hematology   Anesthesia Other Findings   Reproductive/Obstetrics                             Anesthesia Physical Anesthesia Plan  ASA: II  Anesthesia Plan: Spinal   Post-op Pain Management:    Induction: Intravenous  Airway Management Planned: Natural Airway  Additional Equipment:   Intra-op Plan:   Post-operative Plan:   Informed Consent: I have reviewed the patients History and Physical, chart, labs and discussed the procedure including the risks, benefits and alternatives for the proposed anesthesia with the patient or authorized representative who has indicated his/her understanding and acceptance.     Plan Discussed with: CRNA and Surgeon  Anesthesia Plan Comments:         Anesthesia Quick Evaluation  

## 2015-10-10 NOTE — Discharge Instructions (Signed)

## 2015-10-10 NOTE — Transfer of Care (Signed)
Immediate Anesthesia Transfer of Care Note  Patient: Denise Garrett  Procedure(s) Performed: Procedure(s): TOTAL RIGHT HIP ARTHROPLASTY ANTERIOR APPROACH (Right)  Patient Location: PACU  Anesthesia Type:Spinal  Level of Consciousness:  sedated, patient cooperative and responds to stimulation  Airway & Oxygen Therapy:Patient Spontanous Breathing and Patient connected to face mask oxgen  Post-op Assessment:  Report given to PACU RN and Post -op Vital signs reviewed and stable  Post vital signs:  Reviewed and stable  Last Vitals:  Filed Vitals:   10/10/15 0829  BP: 143/73  Pulse: 78  Temp: 36.5 C  Resp: 18    Complications: No apparent anesthesia complications

## 2015-10-10 NOTE — Anesthesia Procedure Notes (Signed)
Spinal Patient location during procedure: OR Start time: 10/10/2015 11:15 AM End time: 10/10/2015 11:22 AM Staffing Anesthesiologist: Lillia Abed Performed by: anesthesiologist  Preanesthetic Checklist Completed: patient identified, site marked, surgical consent, pre-op evaluation, timeout performed, IV checked, risks and benefits discussed and monitors and equipment checked Spinal Block Patient position: sitting Prep: Betadine Patient monitoring: heart rate, cardiac monitor, continuous pulse ox and blood pressure Approach: midline Location: L3-4 Injection technique: single-shot Needle Needle type: Spinocan  Needle gauge: 22 G Needle length: 5 cm Needle insertion depth: 5 cm

## 2015-10-10 NOTE — Anesthesia Postprocedure Evaluation (Signed)
Anesthesia Post Note  Patient: Denise Garrett  Procedure(s) Performed: Procedure(s) (LRB): TOTAL RIGHT HIP ARTHROPLASTY ANTERIOR APPROACH (Right)  Anesthesia type: spinal  Patient location: PACU  Post pain: Pain level controlled  Post assessment: Patient's Cardiovascular Status Stable  Last Vitals:  Filed Vitals:   10/10/15 1445  BP: 128/63  Pulse: 68  Temp:   Resp: 12    Post vital signs: Reviewed and stable  Level of consciousness: awake  Complications: No apparent anesthesia complications

## 2015-10-10 NOTE — Progress Notes (Signed)
Portable AP Pelvis and Lateral Right Hip X-rays done. 

## 2015-10-11 DIAGNOSIS — E663 Overweight: Secondary | ICD-10-CM | POA: Diagnosis present

## 2015-10-11 LAB — BASIC METABOLIC PANEL
ANION GAP: 6 (ref 5–15)
BUN: 13 mg/dL (ref 6–20)
CHLORIDE: 106 mmol/L (ref 101–111)
CO2: 26 mmol/L (ref 22–32)
Calcium: 9 mg/dL (ref 8.9–10.3)
Creatinine, Ser: 0.74 mg/dL (ref 0.44–1.00)
GFR calc non Af Amer: >60 mL/min (ref >60)
GLUCOSE: 136 mg/dL — AB (ref 65–99)
Potassium: 4.4 mmol/L (ref 3.5–5.1)
Sodium: 138 mmol/L (ref 135–145)

## 2015-10-11 LAB — CBC
HEMATOCRIT: 33.9 % — AB (ref 36.0–46.0)
HEMOGLOBIN: 11.3 g/dL — AB (ref 12.0–15.0)
MCH: 28.8 pg (ref 26.0–34.0)
MCHC: 33.3 g/dL (ref 30.0–36.0)
MCV: 86.5 fL (ref 78.0–100.0)
Platelets: 225 10*3/uL (ref 150–400)
RBC: 3.92 MIL/uL (ref 3.87–5.11)
RDW: 12.7 % (ref 11.5–15.5)
WBC: 9.3 10*3/uL (ref 4.0–10.5)

## 2015-10-11 MED ORDER — POLYETHYLENE GLYCOL 3350 17 G PO PACK: 17.0000 g | PACK | Freq: Two times a day (BID) | ORAL | Status: DC

## 2015-10-11 MED ORDER — METHOCARBAMOL 500 MG PO TABS: 500.0000 mg | ORAL_TABLET | Freq: Four times a day (QID) | ORAL | Status: DC | PRN

## 2015-10-11 MED ORDER — ACETAMINOPHEN 500 MG PO TABS: 1000.0000 mg | ORAL_TABLET | Freq: Three times a day (TID) | ORAL | Status: DC

## 2015-10-11 MED ORDER — ASPIRIN 325 MG PO TBEC: 325.0000 mg | DELAYED_RELEASE_TABLET | Freq: Two times a day (BID) | ORAL | Status: DC

## 2015-10-11 MED ORDER — TRAMADOL HCL 50 MG PO TABS: 50.0000 mg | ORAL_TABLET | Freq: Four times a day (QID) | ORAL | Status: DC | PRN

## 2015-10-11 MED ORDER — FERROUS SULFATE 325 (65 FE) MG PO TABS: 325.0000 mg | ORAL_TABLET | Freq: Three times a day (TID) | ORAL | Status: DC

## 2015-10-11 MED ORDER — DOCUSATE SODIUM 100 MG PO CAPS: 100.0000 mg | ORAL_CAPSULE | Freq: Two times a day (BID) | ORAL | Status: DC

## 2015-10-11 NOTE — Evaluation (Signed)
Occupational Therapy Evaluation Patient Details Name: Denise Garrett MRN: [id number] DOB: 04/06/1951 Today's Date: 10/11/2015    History of Present Illness R THR   Clinical Impression   Education complete regarding ADL activity s/p THR    Follow Up Recommendations  No OT follow up    Equipment Recommendations  3 in 1 bedside comode       Precautions / Restrictions Precautions Precautions: Fall Restrictions Weight Bearing Restrictions: No Other Position/Activity Restrictions: WBAT      Mobility Bed Mobility               General bed mobility comments: pt in chair  Transfers Overall transfer level: Needs assistance Equipment used: Rolling walker (2 wheeled) Transfers: Sit to/from Stand Sit to Stand: Min guard;Supervision         General transfer comment: Vc for sequencing         ADL Overall ADL's : Needs assistance/impaired     Grooming: Set up;Sitting           Upper Body Dressing : Set up;Sitting   Lower Body Dressing: Minimal assistance;Sit to/from stand   Toilet Transfer: Min guard;RW   Toileting- Water quality scientist and Hygiene: Min guard;Sit to/from stand         General ADL Comments: VC for safety and technique. Pt performed sit to stand 5 times with increased I with each transition               Pertinent Vitals/Pain Pain Score: 3  Pain Location: R hip Pain Descriptors / Indicators: Sore Pain Intervention(s): Monitored during session     Hand Dominance Right   Extremity/Trunk Assessment Upper Extremity Assessment Upper Extremity Assessment: Overall WFL for tasks assessed           Communication Communication Communication: No difficulties   Cognition Arousal/Alertness: Awake/alert Behavior During Therapy: WFL for tasks assessed/performed Overall Cognitive Status: Within Functional Limits for tasks assessed                     General Comments    Verbalized safety with shower- getting in and  out           Home Living Family/patient expects to be discharged to:: Private residence Living Arrangements: Spouse/significant other Available Help at Discharge: Family Type of Home: House Home Access: Stairs to enter Technical brewer of Steps: 1+1+4+1 Entrance Stairs-Rails: None;Left Home Layout: Able to live on main level with bedroom/bathroom               Home Equipment: Walker - 4 wheels          Prior Functioning/Environment Level of Independence: Independent;Independent with assistive device(s)                      OT Goals(Current goals can be found in the care plan section) Acute Rehab OT Goals Patient Stated Goal: Resume previous lifestyle with decreased pain OT Goal Formulation: With patient  OT Frequency:     Barriers to D/C:               End of Session Equipment Utilized During Treatment: Rolling walker Nurse Communication: Mobility status  Activity Tolerance: Patient tolerated treatment well Patient left: in chair;with call bell/phone within reach   Time: 1231-1244 OT Time Calculation (min): 13 min Charges:  OT General Charges $OT Visit: 1 Procedure OT Evaluation $Initial OT Evaluation Tier I: 1 Procedure OT Treatments $Self Care/Home Management : 8-22 mins G-Codes:  Betsy Pries 10/11/2015, 12:59 PM

## 2015-10-11 NOTE — Evaluation (Signed)
Physical Therapy Evaluation Patient Details Name: Denise Garrett MRN: [id number] DOB: January 02, 1951 Today's Date: 10/11/2015   History of Present Illness  R THR  Clinical Impression  Pt s/p R THR presents with decreased R LE strength/ROM and post op pain and nausea limiting functional mobility.  Pt should progress to dc home with family assist and HHPT.   Follow Up Recommendations Home health PT    Equipment Recommendations  Rolling walker with 5" wheels    Recommendations for Other Services OT consult     Precautions / Restrictions Precautions Precautions: Fall Restrictions Weight Bearing Restrictions: No Other Position/Activity Restrictions: WBAT      Mobility  Bed Mobility Overal bed mobility: Needs Assistance Bed Mobility: Supine to Sit     Supine to sit: Min assist;Mod assist     General bed mobility comments: cues for sequence and use of L LE to self assist  Transfers Overall transfer level: Needs assistance Equipment used: Rolling walker (2 wheeled) Transfers: Sit to/from Stand Sit to Stand: Min assist;Mod assist         General transfer comment: cues for LE management and use of UEs to self assist  Ambulation/Gait Ambulation/Gait assistance: Min assist;Mod assist Ambulation Distance (Feet): 14 Feet Assistive device: Rolling walker (2 wheeled) Gait Pattern/deviations: Step-to pattern;Decreased step length - right;Decreased step length - left;Shuffle;Trunk flexed Gait velocity: decr Gait velocity interpretation: Below normal speed for age/gender General Gait Details: cues for sequence, posture and position from [employer]            Wheelchair Mobility    Modified Rankin (Stroke Patients Only)       Balance                                             Pertinent Vitals/Pain Pain Assessment: 0-10 Pain Score: 6  Pain Location: R hip/thigh Pain Descriptors / Indicators: Aching;Sore Pain Intervention(s): Limited  activity within patient's tolerance;Monitored during session;Premedicated before session;Ice applied    Home Living Family/patient expects to be discharged to:: Private residence Living Arrangements: Spouse/significant other Available Help at Discharge: Family Type of Home: House Home Access: Stairs to enter Entrance Stairs-Rails: Adult nurse of Steps: 1+1+4+1 Home Layout: Able to live on main level with bedroom/bathroom Home Equipment: Walker - 4 wheels      Prior Function Level of Independence: Independent;Independent with assistive device(s)               Hand Dominance   Dominant Hand: Right    Extremity/Trunk Assessment   Upper Extremity Assessment: Overall WFL for tasks assessed           Lower Extremity Assessment: RLE deficits/detail RLE Deficits / Details: Strength at hip 2/5 with AAROM at hip to 85 flex and 10 abd    Cervical / Trunk Assessment: Normal  Communication   Communication: No difficulties  Cognition Arousal/Alertness: Awake/alert Behavior During Therapy: WFL for tasks assessed/performed Overall Cognitive Status: Within Functional Limits for tasks assessed                      General Comments      Exercises Total Joint Exercises Ankle Circles/Pumps: AROM;Both;15 reps;Supine Quad Sets: AROM;Both;10 reps;Supine Heel Slides: AAROM;Right;15 reps;Supine Hip ABduction/ADduction: AAROM;Right;10 reps;Supine      Assessment/Plan    PT Assessment Patient needs continued PT services  PT Diagnosis Difficulty walking  PT Problem List Decreased strength;Decreased range of motion;Decreased activity tolerance;Decreased mobility;Decreased knowledge of use of DME;Pain  PT Treatment Interventions DME instruction;Gait training;Stair training;Functional mobility training;Therapeutic activities;Therapeutic exercise;Patient/family education   PT Goals (Current goals can be found in the Care Plan section) Acute Rehab PT  Goals Patient Stated Goal: Resume previous lifestyle with decreased pain PT Goal Formulation: With patient Time For Goal Achievement: 10/14/15 Potential to Achieve Goals: Good    Frequency 7X/week   Barriers to discharge        Co-evaluation               End of Session Equipment Utilized During Treatment: Gait belt Activity Tolerance: Other (comment) (nausea) Patient left: in chair;with call bell/phone within reach Nurse Communication: Mobility status;Other (comment) (nausea)         Time: 9563-8756 PT Time Calculation (min) (ACUTE ONLY): 25 min   Charges:   PT Evaluation $Initial PT Evaluation Tier I: 1 Procedure PT Treatments $Therapeutic Exercise: 8-22 mins   PT G Codes:        Denise Garrett 16-Oct-2015, 12:07 PM

## 2015-10-11 NOTE — Progress Notes (Signed)
     Subjective: 1 Day Post-Op Procedure(s) (LRB): TOTAL RIGHT HIP ARTHROPLASTY ANTERIOR APPROACH (Right)   Patient reports pain as mild, pain controlled. No events throughout the night. Ready to be discharged home, if she does well with PT.  Objective:   VITALS:   Filed Vitals:   10/11/15 0554  BP: 108/60  Pulse: 61  Temp: 98 F (36.7 C)  Resp: 16    Dorsiflexion/Plantar flexion intact Incision: dressing C/D/I No cellulitis present Compartment soft  LABS  Recent Labs  10/11/15 0518  HGB 11.3*  HCT 33.9*  WBC 9.3  PLT 225     Recent Labs  10/11/15 0518  NA 138  K 4.4  BUN 13  CREATININE 0.74  GLUCOSE 136*     Assessment/Plan: 1 Day Post-Op Procedure(s) (LRB): TOTAL RIGHT HIP ARTHROPLASTY ANTERIOR APPROACH (Right) Foley cath d/c'ed Advance diet Up with therapy D/C IV fluids Discharge home with home health  Follow up in 2 weeks at [facility]. Follow up with OLIN,Murrel Bertram D in 2 weeks.  Contact information:  [facility] [address], [place] [phone]    Overweight (BMI 25-29.9) Estimated body mass index is 27.62 kg/(m^2) as calculated from the following:   Height as of this encounter: 5\' 5"  (1.651 m).   Weight as of this encounter: 75.297 kg (166 lb). Patient also counseled that weight may inhibit the healing process Patient counseled that losing weight will help with future health issues       West Pugh. Skyla Champagne   PAC  10/11/2015, 9:09 AM

## 2015-10-11 NOTE — Care Management Note (Addendum)
Case Management Note  Patient Details  Name: Denise Garrett MRN: [id number] Date of Birth: 1951/03/04  Subjective/Objective:                   TOTAL RIGHT HIP ARTHROPLASTY ANTERIOR APPROACH (Right)  Action/Plan:  Discharge planning Expected Discharge Date:  10/12/15               Expected Discharge Plan:  [place]  In-House Referral:     Discharge planning Services  CM Consult  Post Acute Care Choice:  Home Health Choice offered to:  Patient  DME Arranged:  3-N-1, Walker rolling DME Agency:  [place]:  PT [place]:  [facility]  Status of Service:  Completed, signed off  Medicare Important Message Given:    Date Medicare IM Given:    Medicare IM give by:    Date Additional Medicare IM Given:    Additional Medicare Important Message give by:     If discussed at [place] of Stay Meetings, dates discussed:    Additional Comments: Utilization Review complete. Cm met with pt in room to offer choice of home health agency.  Pt chooses [facility] to render HHPT.  Referral emailed to [employer], Tim.  CM called [facility] DME rep, Lecretia to please deliver the 3n1 and rolling walker to room prior to discharge.  No other CM needs were communicated.   [name], RN 10/11/2015, 11:31 AM

## 2015-10-11 NOTE — Progress Notes (Signed)
Physical Therapy Treatment Patient Details Name: Denise Garrett MRN: [id number] DOB: [birth year] Today's Date: 10/11/2015    History of Present Illness R THR    PT Comments    Pt with good progress with mobility this pm despite fatigue level.  Reviewed bed mobility, car transfers and stairs with pt and spouse.  Follow Up Recommendations  Home health PT     Equipment Recommendations  Rolling walker with 5" wheels    Recommendations for Other Services OT consult     Precautions / Restrictions Precautions Precautions: Fall Restrictions Weight Bearing Restrictions: No Other Position/Activity Restrictions: WBAT    Mobility  Bed Mobility Overal bed mobility: Needs Assistance Bed Mobility: Supine to Sit;Sit to Supine     Supine to sit: Min assist Sit to supine: Min assist   General bed mobility comments: cues for sequence and use of L LE to self assist; Physical assist for R LE  Transfers Overall transfer level: Needs assistance Equipment used: Rolling walker (2 wheeled) Transfers: Sit to/from Stand Sit to Stand: Min guard;Supervision         General transfer comment: Vc for sequencing  Ambulation/Gait Ambulation/Gait assistance: Min guard;Supervision Ambulation Distance (Feet): 48 Feet (and 20') Assistive device: Rolling walker (2 wheeled) Gait Pattern/deviations: Step-to pattern;Step-through pattern;Decreased step length - right;Decreased step length - left;Shuffle;Trunk flexed Gait velocity: decr Gait velocity interpretation: Below normal speed for age/gender General Gait Details: cues for sequence, posture and position from RW   Stairs Stairs: Yes Stairs assistance: Min assist Stair Management: One rail Right;Step to pattern;Forwards;With crutches Number of Stairs: 5 General stair comments: cues for sequence and foot/crutch placement.  Spouse assisting  Wheelchair Mobility    Modified Rankin (Stroke Patients Only)       Balance                                    Cognition Arousal/Alertness: Awake/alert Behavior During Therapy: WFL for tasks assessed/performed Overall Cognitive Status: Within Functional Limits for tasks assessed                      Exercises Total Joint Exercises Ankle Circles/Pumps: AROM;Both;15 reps;Supine    General Comments        Pertinent Vitals/Pain Pain Assessment: 0-10 Pain Score: 4  Pain Location: R hip/thigh Pain Descriptors / Indicators: Aching;Sore Pain Intervention(s): Premedicated before session;Monitored during session;Limited activity within patient's tolerance;Ice applied    Home Living                      Prior Function            PT Goals (current goals can now be found in the care plan section) Acute Rehab PT Goals Patient Stated Goal: Resume previous lifestyle with decreased pain PT Goal Formulation: With patient Time For Goal Achievement: 10/14/15 Progress towards PT goals: Progressing toward goals    Frequency  7X/week    PT Plan Current plan remains appropriate    Co-evaluation             End of Session Equipment Utilized During Treatment: Gait belt Activity Tolerance: Patient tolerated treatment well;Patient limited by fatigue Patient left: in chair;with call bell/phone within reach;with family/visitor present     Time: 1410-1448 PT Time Calculation (min) (ACUTE ONLY): 38 min  Charges:  $Gait Training: 8-22 mins $Therapeutic Exercise: 8-22 mins $Therapeutic Activity: 8-22 mins  G Codes:      Aura Bibby 2015-10-19, 5:01 PM

## 2015-10-16 NOTE — Discharge Summary (Signed)
Physician Discharge Summary  Patient ID: Denise Garrett MRN: [id number] DOB/AGE: October 07, 1951 64 y.o.  Admit date: 10/10/2015 Discharge date: 10/11/2015   Procedures:  Procedure(s) (LRB): TOTAL RIGHT HIP ARTHROPLASTY ANTERIOR APPROACH (Right)  Attending Physician:  Dr. Paralee Cancel   Admission Diagnoses:   Right hip primary OA / pain  Discharge Diagnoses:  Principal Problem:   S/P right THA, AA Active Problems:   Overweight (BMI 25.0-29.9)  Past Medical History  Diagnosis Date  . Anemia   . Hx of colonic polyps   . Trigger finger     left ring finger  . Arthritis   . Hypothyroidism     HPI:    Denise Garrett, 64 y.o. female, has a history of pain and functional disability in the right hip(s) due to arthritis and patient has failed non-surgical conservative treatments for greater than 12 weeks to include NSAID's and/or analgesics, corticosteriod injections, use of assistive devices and activity modification. Onset of symptoms was gradual starting years ago with rapidlly worsening course since that time.The patient noted no past surgery on the right hip(s). Patient currently rates pain in the right hip at 9 out of 10 with activity. Patient has night pain, worsening of pain with activity and weight bearing, trendelenberg gait, pain that interfers with activities of daily living and pain with passive range of motion. Patient has evidence of periarticular osteophytes and joint space narrowing by imaging studies. This condition presents safety issues increasing the risk of falls. There is no current active infection. Risks, benefits and expectations were discussed with the patient. Risks including but not limited to the risk of anesthesia, blood clots, nerve damage, blood vessel damage, failure of the prosthesis, infection and up to and including death. Patient understand the risks, benefits and expectations and wishes to proceed with surgery.   PCP: Lottie Dawson, MD    Discharged Condition: good  Hospital Course:  Patient underwent the above stated procedure on 10/10/2015. Patient tolerated the procedure well and brought to the recovery room in good condition and subsequently to the floor.  POD #1 BP: 108/60 ; Pulse: 61 ; Temp: 98 F (36.7 C) ; Resp: 16 Patient reports pain as mild, pain controlled. No events throughout the night. Ready to be discharged home. Dorsiflexion/plantar flexion intact, incision: dressing C/D/I, no cellulitis present and compartment soft.   LABS  Basename    HGB  11.3  HCT  33.9    Discharge Exam: General appearance: alert, cooperative and no distress Extremities: Homans sign is negative, no sign of DVT, no edema, redness or tenderness in the calves or thighs and no ulcers, gangrene or trophic changes  Disposition: Home with follow up in 2 weeks   Follow-up Information    Follow up with Mauri Pole, MD. Schedule an appointment as soon as possible for a visit in 2 weeks.   Specialty:  Orthopedic Surgery   Contact information:   [address] [place] [address] [phone]       Follow up with [facility].   Why:  home health physical therapy   Contact information:   [place] [address] [phone]       Follow up with [facility].   Why:  rolling walker and 3n1 (over the commode seat)   Contact information:   [address] [place] [address] [phone]       Discharge Instructions    Call MD /  Call 911    Complete by:  As directed   If you experience chest pain or shortness of breath, CALL 911 and be transported to the hospital emergency room.  If you develope a fever above 101 F, pus (white drainage) or increased drainage or redness at the wound, or calf pain, call your surgeon's office.     Change dressing    Complete by:  As directed   Maintain surgical dressing until follow up in the clinic. If the edges  start to pull up, may reinforce with tape. If the dressing is no longer working, may remove and cover with gauze and tape, but must keep the area dry and clean.  Call with any questions or concerns.     Constipation Prevention    Complete by:  As directed   Drink plenty of fluids.  Prune juice may be helpful.  You may use a stool softener, such as Colace (over the counter) 100 mg twice a day.  Use MiraLax (over the counter) for constipation as needed.     Diet - low sodium heart healthy    Complete by:  As directed      Discharge instructions    Complete by:  As directed   Maintain surgical dressing until follow up in the clinic. If the edges start to pull up, may reinforce with tape. If the dressing is no longer working, may remove and cover with gauze and tape, but must keep the area dry and clean.  Follow up in 2 weeks at [facility]. Call with any questions or concerns.     Increase activity slowly as tolerated    Complete by:  As directed   Weight bearing as tolerated with assist device (walker, cane, etc) as directed, use it as long as suggested by your surgeon or therapist, typically at least 4-6 weeks.     TED hose    Complete by:  As directed   Use stockings (TED hose) for 2 weeks on both leg(s).  You may remove them at night for sleeping.             Medication List    TAKE these medications        acetaminophen 500 MG tablet  Commonly known as:  TYLENOL  Take 2 tablets (1,000 mg total) by mouth every 8 (eight) hours.     aspirin 325 MG EC tablet  Take 1 tablet (325 mg total) by mouth 2 (two) times daily.     docusate sodium 100 MG capsule  Commonly known as:  COLACE  Take 1 capsule (100 mg total) by mouth 2 (two) times daily.     ferrous sulfate 325 (65 FE) MG tablet  Take 1 tablet (325 mg total) by mouth 3 (three) times daily after meals.     levothyroxine 88 MCG tablet  Commonly known as:  SYNTHROID, LEVOTHROID  TAKE 1 TABLET DAILY     methocarbamol  500 MG tablet  Commonly known as:  ROBAXIN  Take 1 tablet (500 mg total) by mouth every 6 (six) hours as needed for muscle spasms.     polyethylene glycol packet  Commonly known as:  MIRALAX / GLYCOLAX  Take 17 g by mouth 2 (two) times daily.     traMADol 50 MG tablet  Commonly known as:  ULTRAM  Take 1-2 tablets (50-100 mg total) by mouth every 6 (six) hours as needed.         Signed: West Pugh. Brinlyn Cena  PA-C  10/16/2015, 12:36 PM

## 2015-10-31 ENCOUNTER — Other Ambulatory Visit (INDEPENDENT_AMBULATORY_CARE_PROVIDER_SITE_OTHER): Payer: BC Managed Care – PPO

## 2015-10-31 DIAGNOSIS — Z0000 Encounter for general adult medical examination without abnormal findings: Secondary | ICD-10-CM

## 2015-10-31 LAB — CBC WITH DIFFERENTIAL/PLATELET
Basophils Absolute: 0 10*3/uL (ref 0.0–0.1)
Basophils Relative: 0.8 % (ref 0.0–3.0)
EOS PCT: 2.2 % (ref 0.0–5.0)
Eosinophils Absolute: 0.1 10*3/uL (ref 0.0–0.7)
HCT: 41.1 % (ref 36.0–46.0)
HEMOGLOBIN: 13.7 g/dL (ref 12.0–15.0)
LYMPHS PCT: 24.5 % (ref 12.0–46.0)
Lymphs Abs: 1.2 10*3/uL (ref 0.7–4.0)
MCHC: 33.4 g/dL (ref 30.0–36.0)
MCV: 86.7 fl (ref 78.0–100.0)
MONO ABS: 0.3 10*3/uL (ref 0.1–1.0)
MONOS PCT: 6.6 % (ref 3.0–12.0)
Neutro Abs: 3.1 10*3/uL (ref 1.4–7.7)
Neutrophils Relative %: 65.9 % (ref 43.0–77.0)
Platelets: 396 10*3/uL (ref 150.0–400.0)
RBC: 4.74 Mil/uL (ref 3.87–5.11)
RDW: 14.4 % (ref 11.5–15.5)
WBC: 4.7 10*3/uL (ref 4.0–10.5)

## 2015-10-31 LAB — LIPID PANEL
Cholesterol: 180 mg/dL (ref 0–200)
HDL: 35 mg/dL — ABNORMAL LOW (ref >39.00)
LDL CALC: 126 mg/dL — AB (ref 0–99)
NonHDL: 144.62
TRIGLYCERIDES: 94 mg/dL (ref 0.0–149.0)
Total CHOL/HDL Ratio: 5
VLDL: 18.8 mg/dL (ref 0.0–40.0)

## 2015-10-31 LAB — HEPATIC FUNCTION PANEL
ALBUMIN: 4 g/dL (ref 3.5–5.2)
ALT: 17 U/L (ref 0–35)
AST: 13 U/L (ref 0–37)
Alkaline Phosphatase: 97 U/L (ref 39–117)
Bilirubin, Direct: 0.1 mg/dL (ref 0.0–0.3)
TOTAL PROTEIN: 6.7 g/dL (ref 6.0–8.3)
Total Bilirubin: 0.4 mg/dL (ref 0.2–1.2)

## 2015-10-31 LAB — BASIC METABOLIC PANEL
BUN: 17 mg/dL (ref 6–23)
CO2: 28 mEq/L (ref 19–32)
Calcium: 10 mg/dL (ref 8.4–10.5)
Chloride: 108 mEq/L (ref 96–112)
Creatinine, Ser: 0.9 mg/dL (ref 0.40–1.20)
GFR: 67.08 mL/min (ref >60.00)
Glucose, Bld: 88 mg/dL (ref 70–99)
POTASSIUM: 5.3 meq/L — AB (ref 3.5–5.1)
SODIUM: 143 meq/L (ref 135–145)

## 2015-10-31 LAB — TSH: TSH: 1.19 u[IU]/mL (ref 0.35–4.50)

## 2015-11-07 ENCOUNTER — Other Ambulatory Visit (HOSPITAL_COMMUNITY)
Admission: RE | Admit: 2015-11-07 | Discharge: 2015-11-07 | Disposition: A | Discharge status: Home / Self Care | Payer: BC Managed Care – PPO | Source: Ambulatory Visit | Attending: Internal Medicine | Admitting: Internal Medicine

## 2015-11-07 ENCOUNTER — Other Ambulatory Visit: Payer: Self-pay | Admitting: Internal Medicine

## 2015-11-07 ENCOUNTER — Ambulatory Visit (INDEPENDENT_AMBULATORY_CARE_PROVIDER_SITE_OTHER): Payer: BC Managed Care – PPO | Admitting: Internal Medicine

## 2015-11-07 ENCOUNTER — Encounter: Payer: Self-pay | Admitting: Internal Medicine

## 2015-11-07 VITALS — BP 118/74 | Temp 98.2°F | Ht 65.0 in | Wt 162.5 lb

## 2015-11-07 DIAGNOSIS — G479 Sleep disorder, unspecified: Secondary | ICD-10-CM

## 2015-11-07 DIAGNOSIS — Z1151 Encounter for screening for human papillomavirus (HPV): Secondary | ICD-10-CM | POA: Insufficient documentation

## 2015-11-07 DIAGNOSIS — E039 Hypothyroidism, unspecified: Secondary | ICD-10-CM

## 2015-11-07 DIAGNOSIS — Z01419 Encounter for gynecological examination (general) (routine) without abnormal findings: Secondary | ICD-10-CM | POA: Insufficient documentation

## 2015-11-07 DIAGNOSIS — Z0000 Encounter for general adult medical examination without abnormal findings: Secondary | ICD-10-CM

## 2015-11-07 MED ORDER — ZOLPIDEM TARTRATE 5 MG PO TABS: ORAL_TABLET | ORAL | Status: DC

## 2015-11-07 NOTE — Patient Instructions (Addendum)
Continue lifestyle intervention healthy eating and exercise .  Will notify you when pap results are available. If ok hpv.     Health Maintenance, Female Adopting a healthy lifestyle and getting preventive care can go a long way to promote health and wellness. Talk with your health care provider about what schedule of regular examinations is right for you. This is a good chance for you to check in with your provider about disease prevention and staying healthy. In between checkups, there are plenty of things you can do on your own. Experts have done a lot of research about which lifestyle changes and preventive measures are most likely to keep you healthy. Ask your health care provider for more information. WEIGHT AND DIET  Eat a healthy diet  Be sure to include plenty of vegetables, fruits, low-fat dairy products, and lean protein.  Do not eat a lot of foods high in solid fats, added sugars, or salt.  Get regular exercise. This is one of the most important things you can do for your health.  Most adults should exercise for at least 150 minutes each week. The exercise should increase your heart rate and make you sweat (moderate-intensity exercise).  Most adults should also do strengthening exercises at least twice a week. This is in addition to the moderate-intensity exercise.  Maintain a healthy weight  Body mass index (BMI) is a measurement that can be used to identify possible weight problems. It estimates body fat based on height and weight. Your health care provider can help determine your BMI and help you achieve or maintain a healthy weight.  For females 39 years of age and older:   A BMI below 18.5 is considered underweight.  A BMI of 18.5 to 24.9 is normal.  A BMI of 25 to 29.9 is considered overweight.  A BMI of 30 and above is considered obese.  Watch levels of cholesterol and blood lipids  You should start having your blood tested for lipids and cholesterol at 63  years of age, then have this test every 5 years.  You may need to have your cholesterol levels checked more often if:  Your lipid or cholesterol levels are high.  You are older than 63 years of age.  You are at high risk for heart disease.  CANCER SCREENING   Lung Cancer  Lung cancer screening is recommended for adults 34-3 years old who are at high risk for lung cancer because of a history of smoking.  A yearly low-dose CT scan of the lungs is recommended for people who:  Currently smoke.  Have quit within the past 15 years.  Have at least a 30-pack-year history of smoking. A pack year is smoking an average of one pack of cigarettes a day for 1 year.  Yearly screening should continue until it has been 15 years since you quit.  Yearly screening should stop if you develop a health problem that would prevent you from having lung cancer treatment.  Breast Cancer  Practice breast self-awareness. This means understanding how your breasts normally appear and feel.  It also means doing regular breast self-exams. Let your health care provider know about any changes, no matter how small.  If you are in your 20s or 30s, you should have a clinical breast exam (CBE) by a health care provider every 1-3 years as part of a regular health exam.  If you are 62 or older, have a CBE every year. Also consider having a breast X-ray (mammogram)  every year.  If you have a family history of breast cancer, talk to your health care provider about genetic screening.  If you are at high risk for breast cancer, talk to your health care provider about having an MRI and a mammogram every year.  Breast cancer gene (BRCA) assessment is recommended for women who have family members with BRCA-related cancers. BRCA-related cancers include:  Breast.  Ovarian.  Tubal.  Peritoneal cancers.  Results of the assessment will determine the need for genetic counseling and BRCA1 and BRCA2 testing. Cervical  Cancer Your health care provider may recommend that you be screened regularly for cancer of the pelvic organs (ovaries, uterus, and vagina). This screening involves a pelvic examination, including checking for microscopic changes to the surface of your cervix (Pap test). You may be encouraged to have this screening done every 3 years, beginning at age 71.  For women ages 27-65, health care providers may recommend pelvic exams and Pap testing every 3 years, or they may recommend the Pap and pelvic exam, combined with testing for human papilloma virus (HPV), every 5 years. Some types of HPV increase your risk of cervical cancer. Testing for HPV may also be done on women of any age with unclear Pap test results.  Other health care providers may not recommend any screening for nonpregnant women who are considered low risk for pelvic cancer and who do not have symptoms. Ask your health care provider if a screening pelvic exam is right for you.  If you have had past treatment for cervical cancer or a condition that could lead to cancer, you need Pap tests and screening for cancer for at least 20 years after your treatment. If Pap tests have been discontinued, your risk factors (such as having a new sexual partner) need to be reassessed to determine if screening should resume. Some women have medical problems that increase the chance of getting cervical cancer. In these cases, your health care provider may recommend more frequent screening and Pap tests. Colorectal Cancer  This type of cancer can be detected and often prevented.  Routine colorectal cancer screening usually begins at 63 years of age and continues through 63 years of age.  Your health care provider may recommend screening at an earlier age if you have risk factors for colon cancer.  Your health care provider may also recommend using home test kits to check for hidden blood in the stool.  A small camera at the end of a tube can be used to  examine your colon directly (sigmoidoscopy or colonoscopy). This is done to check for the earliest forms of colorectal cancer.  Routine screening usually begins at age 79.  Direct examination of the colon should be repeated every 5-10 years through 63 years of age. However, you may need to be screened more often if early forms of precancerous polyps or small growths are found. Skin Cancer  Check your skin from head to toe regularly.  Tell your health care provider about any new moles or changes in moles, especially if there is a change in a mole's shape or color.  Also tell your health care provider if you have a mole that is larger than the size of a pencil eraser.  Always use sunscreen. Apply sunscreen liberally and repeatedly throughout the day.  Protect yourself by wearing long sleeves, pants, a wide-brimmed hat, and sunglasses whenever you are outside. HEART DISEASE, DIABETES, AND HIGH BLOOD PRESSURE   High blood pressure causes heart disease and  increases the risk of stroke. High blood pressure is more likely to develop in:  People who have blood pressure in the high end of the normal range (130-139/85-89 mm Hg).  People who are overweight or obese.  People who are African American.  If you are 61-12 years of age, have your blood pressure checked every 3-5 years. If you are 49 years of age or older, have your blood pressure checked every year. You should have your blood pressure measured twice--once when you are at a hospital or clinic, and once when you are not at a hospital or clinic. Record the average of the two measurements. To check your blood pressure when you are not at a hospital or clinic, you can use:  An automated blood pressure machine at a pharmacy.  A home blood pressure monitor.  If you are between 72 years and 80 years old, ask your health care provider if you should take aspirin to prevent strokes.  Have regular diabetes screenings. This involves taking a  blood sample to check your fasting blood sugar level.  If you are at a normal weight and have a low risk for diabetes, have this test once every three years after 63 years of age.  If you are overweight and have a high risk for diabetes, consider being tested at a younger age or more often. PREVENTING INFECTION  Hepatitis B  If you have a higher risk for hepatitis B, you should be screened for this virus. You are considered at high risk for hepatitis B if:  You were born in a country where hepatitis B is common. Ask your health care provider which countries are considered high risk.  Your parents were born in a high-risk country, and you have not been immunized against hepatitis B (hepatitis B vaccine).  You have HIV or AIDS.  You use needles to inject street drugs.  You live with someone who has hepatitis B.  You have had sex with someone who has hepatitis B.  You get hemodialysis treatment.  You take certain medicines for conditions, including cancer, organ transplantation, and autoimmune conditions. Hepatitis C  Blood testing is recommended for:  Everyone born from [birth year] through 1964.  Anyone with known risk factors for hepatitis C. Sexually transmitted infections (STIs)  You should be screened for sexually transmitted infections (STIs) including gonorrhea and chlamydia if:  You are sexually active and are younger than 63 years of age.  You are older than 63 years of age and your health care provider tells you that you are at risk for this type of infection.  Your sexual activity has changed since you were last screened and you are at an increased risk for chlamydia or gonorrhea. Ask your health care provider if you are at risk.  If you do not have HIV, but are at risk, it may be recommended that you take a prescription medicine daily to prevent HIV infection. This is called pre-exposure prophylaxis (PrEP). You are considered at risk if:  You are sexually active and do  not regularly use condoms or know the HIV status of your partner(s).  You take drugs by injection.  You are sexually active with a partner who has HIV. Talk with your health care provider about whether you are at high risk of being infected with HIV. If you choose to begin PrEP, you should first be tested for HIV. You should then be tested every 3 months for as long as you are taking PrEP.  PREGNANCY   If you are premenopausal and you may become pregnant, ask your health care provider about preconception counseling.  If you may become pregnant, take 400 to 800 micrograms (mcg) of folic acid every day.  If you want to prevent pregnancy, talk to your health care provider about birth control (contraception). OSTEOPOROSIS AND MENOPAUSE   Osteoporosis is a disease in which the bones lose minerals and strength with aging. This can result in serious bone fractures. Your risk for osteoporosis can be identified using a bone density scan.  If you are 49 years of age or older, or if you are at risk for osteoporosis and fractures, ask your health care provider if you should be screened.  Ask your health care provider whether you should take a calcium or vitamin D supplement to lower your risk for osteoporosis.  Menopause may have certain physical symptoms and risks.  Hormone replacement therapy may reduce some of these symptoms and risks. Talk to your health care provider about whether hormone replacement therapy is right for you.  HOME CARE INSTRUCTIONS   Schedule regular health, dental, and eye exams.  Stay current with your immunizations.   Do not use any tobacco products including cigarettes, chewing tobacco, or electronic cigarettes.  If you are pregnant, do not drink alcohol.  If you are breastfeeding, limit how much and how often you drink alcohol.  Limit alcohol intake to no more than 1 drink per day for nonpregnant women. One drink equals 12 ounces of beer, 5 ounces of wine, or 1  ounces of hard liquor.  Do not use street drugs.  Do not share needles.  Ask your health care provider for help if you need support or information about quitting drugs.  Tell your health care provider if you often feel depressed.  Tell your health care provider if you have ever been abused or do not feel safe at home.   This information is not intended to replace advice given to you by your health care provider. Make sure you discuss any questions you have with your health care provider.   Document Released: 06/03/2011 Document Revised: 12/09/2014 Document Reviewed: 10/20/2013 [employer] Interactive Patient Education [employer].

## 2015-11-07 NOTE — Progress Notes (Signed)
Chief Complaint  Patient presents with  . Annual Exam    had hip replacment  refill Denise Garrett uses infrequently     HPI: Patient  Denise Garrett  64 y.o. comes in today for Preventive Health Care visit  Had recent surgery  trh '11 16  4 ' weeks out walling more  90 % pain better  Thyroid  No change on med for 20 years    Health Maintenance  Topic Date Due  . PAP SMEAR  02/14/2015  . HIV Screening  11/06/2016 (Originally 03/12/1966)  . INFLUENZA VACCINE  07/02/2016  . MAMMOGRAM  08/18/2016  . COLONOSCOPY  01/18/2018  . TETANUS/TDAP  09/25/2020  . ZOSTAVAX  Completed  . Hepatitis C Screening  Completed   Health Maintenance Review LIFESTYLE:  Exercise:   Hip rehabe.  Tobacco/ETS:n Alcohol: per day  ocass Sugar beverages:n Sleep: 5 hours  ocass ambien use  Asks rx  Lasts a while Drug use: no LMP about age 49  Pap 3 years ago   ROS:  GEN/ HEENT: No fever, significant weight changes sweats headaches vision problems hearing changes, CV/ PULM; No chest pain shortness of breath cough, syncope,edema  change in exercise tolerance. GI /GU: No adominal pain, vomiting, change in bowel habits. No blood in the stool. No significant GU symptoms. SKIN/HEME: ,no acute skin rashes suspicious lesions or bleeding. No lymphadenopathy, nodules, masses.  NEURO/ PSYCH:  No neurologic signs such as weakness numbness. No depression anxiety. IMM/ Allergy: No unusual infections.  Allergy .   REST of 12 system review negative except as per HPI   Past Medical History  Diagnosis Date  . Anemia   . Hx of colonic polyps   . Trigger finger     left ring finger  . Arthritis   . Hypothyroidism     Past Surgical History  Procedure Laterality Date  . Exploratory laparotomy    . Bunion removed    . Polypectomy    . Colonoscopy    . Wisdom tooth extraction    . Total hip arthroplasty Right 10/10/2015    Procedure: TOTAL RIGHT HIP ARTHROPLASTY ANTERIOR APPROACH;  Surgeon: Paralee Cancel, MD;  Location:  [facility];  Service: Orthopedics;  Laterality: Right;    Family History  Problem Relation Age of Onset  . Heart attack Father   . Coronary artery disease Father   . Coronary artery disease Brother     quad bypass age 28  . Colon cancer Neg Hx   . Esophageal cancer Neg Hx   . Prostate cancer Neg Hx   . Rectal cancer Neg Hx     Social History   Social History  . Marital Status: Married    Spouse Name: N/A  . Number of Children: 1  . Years of Education: N/A   Occupational History  .  [employer]   Social History Main Topics  . Smoking status: Never Smoker   . Smokeless tobacco: Never Used  . Alcohol Use: 4.2 oz/week    7 Glasses of wine per week     Comment: occasional  . Drug Use: No  . Sexual Activity: Not Asked   Other Topics Concern  . 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   Considering  retire ing in a couple years.   [place] sleep interrupted  With late night noise    Outpatient Prescriptions Prior to Visit  Medication Sig Dispense Refill  . acetaminophen (TYLENOL) 500 MG tablet Take 2 tablets (1,000 mg total) by mouth every 8 (eight) hours. 30 tablet 0  . levothyroxine (SYNTHROID, LEVOTHROID) 88 MCG tablet TAKE 1 TABLET DAILY 90 tablet 0  . aspirin EC 325 MG EC tablet Take 1 tablet (325 mg total) by mouth 2 (two) times daily. 60 tablet 0  . docusate sodium (COLACE) 100 MG capsule Take 1 capsule (100 mg total) by mouth 2 (two) times daily. 10 capsule 0  . ferrous sulfate 325 (65 FE) MG tablet Take 1 tablet (325 mg total) by mouth 3 (three) times daily after meals.  3  . methocarbamol (ROBAXIN) 500 MG tablet Take 1 tablet (500 mg total) by mouth every 6 (six) hours as needed for muscle spasms. 50 tablet 0  . polyethylene glycol (MIRALAX / GLYCOLAX) packet Take 17 g by mouth 2 (two) times daily. 14 each 0  . traMADol  (ULTRAM) 50 MG tablet Take 1-2 tablets (50-100 mg total) by mouth every 6 (six) hours as needed. 80 tablet 0   No facility-administered medications prior to visit.     EXAM:  BP 118/74 mmHg  Temp(Src) 98.2 F (36.8 C) (Oral)  Ht '5\' 5"'  (1.651 m)  Wt 162 lb 8 oz (73.71 kg)  BMI 27.04 kg/m2  Body mass index is 27.04 kg/(m^2).  Physical Exam: Vital signs reviewed MGN:OIBB is a well-developed well-nourished alert cooperative    who appearsr stated age in no acute distress.  HEENT: normocephalic atraumatic , Eyes: PERRL EOM's full, conjunctiva clear, Nares: paten,t no deformity discharge or tenderness., Ears: no deformity EAC's clear TMs with normal landmarks. Mouth: clear OP, no lesions, edema.  Moist mucous membranes. Dentition in adequate repair. NECK: supple without masses, thyromegaly or bruits. CHEST/PULM:  Clear to auscultation and percussion breath sounds equal no wheeze , rales or rhonchi. No chest wall deformities or tenderness.Breast: normal by inspection . No dimpling, discharge, masses, tenderness or discharge . CV: PMI is nondisplaced, S1 S2 no gallops, murmurs, rubs. Peripheral pulses are full without delay.No JVD .  ABDOMEN: Bowel sounds normal nontender  No guard or rebound, no hepato splenomegal no CVA tenderness.  No hernia. Extremtities:  No clubbing cyanosis or edema, no acute joint swelling or redness no focal atrophy NEURO:  Oriented x3, cranial nerves 3-12 appear to be intact, no obvious focal weakness,gait within normal limits no abnormal reflexes or asymmetrical SKIN: No acute rashes normal turgor, color, no bruising or petechiae. PSYCH: Oriented, good eye contact, no obvious depression anxiety, cognition and judgment appear normal. LN: no cervical axillary inguinal adenopathy.wppel Pelvic: NL ext GU, labia clear without lesions or rash . Vagina no lesions .Cervix: clear  UTERUS: Neg CMT Adnexa:  clear no masses . PAP done w HR HPV  Rectal no masses stool hem  neg   Lab Results  Component Value Date   WBC 4.7 10/31/2015   HGB 13.7 10/31/2015   HCT 41.1 10/31/2015   PLT 396.0 10/31/2015   GLUCOSE 88 10/31/2015   CHOL 180 10/31/2015   TRIG 94.0 10/31/2015   HDL 35.00* 10/31/2015   LDLCALC 126* 10/31/2015   ALT 17 10/31/2015   AST 13 10/31/2015   NA 143 10/31/2015   K 5.3* 10/31/2015   CL 108 10/31/2015   CREATININE 0.90 10/31/2015   BUN 17  10/31/2015   CO2 28 10/31/2015   TSH 1.19 10/31/2015   INR 1.01 09/29/2015    ASSESSMENT AND PLAN:  Discussed the following assessment and plan:  Visit for preventive health examination - Plan: PAP [[place]]  Encounter for routine gynecological examination - Plan: PAP [[place]]  Hypothyroidism, unspecified hypothyroidism type - 20 years of meds prob to continue done have orig records  had fatigyue and told tests off  Sleep difficulties - ocass med use  limited   Patient Care Team: Burnis Medin, MD as PCP - General Jola Baptist, DC (Chiropractic Medicine) Paralee Cancel, MD as Consulting Physician (Orthopedic Surgery) Patient Instructions  Continue lifestyle intervention healthy eating and exercise .  Will notify you when pap results are available. If ok hpv.     Health Maintenance, Female Adopting a healthy lifestyle and getting preventive care can go a long way to promote health and wellness. Talk with your health care provider about what schedule of regular examinations is right for you. This is a good chance for you to check in with your provider about disease prevention and staying healthy. In between checkups, there are plenty of things you can do on your own. Experts have done a lot of research about which lifestyle changes and preventive measures are most likely to keep you healthy. Ask your health care provider for more information. WEIGHT AND DIET  Eat a healthy diet  Be sure to include plenty of vegetables, fruits, low-fat dairy products, and lean protein.  Do  not eat a lot of foods high in solid fats, added sugars, or salt.  Get regular exercise. This is one of the most important things you can do for your health.  Most adults should exercise for at least 150 minutes each week. The exercise should increase your heart rate and make you sweat (moderate-intensity exercise).  Most adults should also do strengthening exercises at least twice a week. This is in addition to the moderate-intensity exercise.  Maintain a healthy weight  Body mass index (BMI) is a measurement that can be used to identify possible weight problems. It estimates body fat based on height and weight. Your health care provider can help determine your BMI and help you achieve or maintain a healthy weight.  For females 28 years of age and older:   A BMI below 18.5 is considered underweight.  A BMI of 18.5 to 24.9 is normal.  A BMI of 25 to 29.9 is considered overweight.  A BMI of 30 and above is considered obese.  Watch levels of cholesterol and blood lipids  You should start having your blood tested for lipids and cholesterol at 63 years of age, then have this test every 5 years.  You may need to have your cholesterol levels checked more often if:  Your lipid or cholesterol levels are high.  You are older than 63 years of age.  You are at high risk for heart disease.  CANCER SCREENING   Lung Cancer  Lung cancer screening is recommended for adults 33-76 years old who are at high risk for lung cancer because of a history of smoking.  A yearly low-dose CT scan of the lungs is recommended for people who:  Currently smoke.  Have quit within the past 15 years.  Have at least a 30-pack-year history of smoking. A pack year is smoking an average of one pack of cigarettes a day for 1 year.  Yearly screening should continue until it has been 15  years since you quit.  Yearly screening should stop if you develop a health problem that would prevent you from having  lung cancer treatment.  Breast Cancer  Practice breast self-awareness. This means understanding how your breasts normally appear and feel.  It also means doing regular breast self-exams. Let your health care provider know about any changes, no matter how small.  If you are in your 20s or 30s, you should have a clinical breast exam (CBE) by a health care provider every 1-3 years as part of a regular health exam.  If you are 109 or older, have a CBE every year. Also consider having a breast X-ray (mammogram) every year.  If you have a family history of breast cancer, talk to your health care provider about genetic screening.  If you are at high risk for breast cancer, talk to your health care provider about having an MRI and a mammogram every year.  Breast cancer gene (BRCA) assessment is recommended for women who have family members with BRCA-related cancers. BRCA-related cancers include:  Breast.  Ovarian.  Tubal.  Peritoneal cancers.  Results of the assessment will determine the need for genetic counseling and BRCA1 and BRCA2 testing. Cervical Cancer Your health care provider may recommend that you be screened regularly for cancer of the pelvic organs (ovaries, uterus, and vagina). This screening involves a pelvic examination, including checking for microscopic changes to the surface of your cervix (Pap test). You may be encouraged to have this screening done every 3 years, beginning at age 32.  For women ages 52-65, health care providers may recommend pelvic exams and Pap testing every 3 years, or they may recommend the Pap and pelvic exam, combined with testing for human papilloma virus (HPV), every 5 years. Some types of HPV increase your risk of cervical cancer. Testing for HPV may also be done on women of any age with unclear Pap test results.  Other health care providers may not recommend any screening for nonpregnant women who are considered low risk for pelvic cancer and who  do not have symptoms. Ask your health care provider if a screening pelvic exam is right for you.  If you have had past treatment for cervical cancer or a condition that could lead to cancer, you need Pap tests and screening for cancer for at least 20 years after your treatment. If Pap tests have been discontinued, your risk factors (such as having a new sexual partner) need to be reassessed to determine if screening should resume. Some women have medical problems that increase the chance of getting cervical cancer. In these cases, your health care provider may recommend more frequent screening and Pap tests. Colorectal Cancer  This type of cancer can be detected and often prevented.  Routine colorectal cancer screening usually begins at 63 years of age and continues through 63 years of age.  Your health care provider may recommend screening at an earlier age if you have risk factors for colon cancer.  Your health care provider may also recommend using home test kits to check for hidden blood in the stool.  A small camera at the end of a tube can be used to examine your colon directly (sigmoidoscopy or colonoscopy). This is done to check for the earliest forms of colorectal cancer.  Routine screening usually begins at age 30.  Direct examination of the colon should be repeated every 5-10 years through 63 years of age. However, you may need to be screened more often if early  forms of precancerous polyps or small growths are found. Skin Cancer  Check your skin from head to toe regularly.  Tell your health care provider about any new moles or changes in moles, especially if there is a change in a mole's shape or color.  Also tell your health care provider if you have a mole that is larger than the size of a pencil eraser.  Always use sunscreen. Apply sunscreen liberally and repeatedly throughout the day.  Protect yourself by wearing long sleeves, pants, a wide-brimmed hat, and sunglasses  whenever you are outside. HEART DISEASE, DIABETES, AND HIGH BLOOD PRESSURE   High blood pressure causes heart disease and increases the risk of stroke. High blood pressure is more likely to develop in:  People who have blood pressure in the high end of the normal range (130-139/85-89 mm Hg).  People who are overweight or obese.  People who are African American.  If you are 40-43 years of age, have your blood pressure checked every 3-5 years. If you are 17 years of age or older, have your blood pressure checked every year. You should have your blood pressure measured twice--once when you are at a hospital or clinic, and once when you are not at a hospital or clinic. Record the average of the two measurements. To check your blood pressure when you are not at a hospital or clinic, you can use:  An automated blood pressure machine at a pharmacy.  A home blood pressure monitor.  If you are between 36 years and 73 years old, ask your health care provider if you should take aspirin to prevent strokes.  Have regular diabetes screenings. This involves taking a blood sample to check your fasting blood sugar level.  If you are at a normal weight and have a low risk for diabetes, have this test once every three years after 63 years of age.  If you are overweight and have a high risk for diabetes, consider being tested at a younger age or more often. PREVENTING INFECTION  Hepatitis B  If you have a higher risk for hepatitis B, you should be screened for this virus. You are considered at high risk for hepatitis B if:  You were born in a country where hepatitis B is common. Ask your health care provider which countries are considered high risk.  Your parents were born in a high-risk country, and you have not been immunized against hepatitis B (hepatitis B vaccine).  You have HIV or AIDS.  You use needles to inject street drugs.  You live with someone who has hepatitis B.  You have had sex  with someone who has hepatitis B.  You get hemodialysis treatment.  You take certain medicines for conditions, including cancer, organ transplantation, and autoimmune conditions. Hepatitis C  Blood testing is recommended for:  Everyone born from [birth year] through 1964.  Anyone with known risk factors for hepatitis C. Sexually transmitted infections (STIs)  You should be screened for sexually transmitted infections (STIs) including gonorrhea and chlamydia if:  You are sexually active and are younger than 63 years of age.  You are older than 63 years of age and your health care provider tells you that you are at risk for this type of infection.  Your sexual activity has changed since you were last screened and you are at an increased risk for chlamydia or gonorrhea. Ask your health care provider if you are at risk.  If you do not have HIV, but are  at risk, it may be recommended that you take a prescription medicine daily to prevent HIV infection. This is called pre-exposure prophylaxis (PrEP). You are considered at risk if:  You are sexually active and do not regularly use condoms or know the HIV status of your partner(s).  You take drugs by injection.  You are sexually active with a partner who has HIV. Talk with your health care provider about whether you are at high risk of being infected with HIV. If you choose to begin PrEP, you should first be tested for HIV. You should then be tested every 3 months for as long as you are taking PrEP.  PREGNANCY   If you are premenopausal and you may become pregnant, ask your health care provider about preconception counseling.  If you may become pregnant, take 400 to 800 micrograms (mcg) of folic acid every day.  If you want to prevent pregnancy, talk to your health care provider about birth control (contraception). OSTEOPOROSIS AND MENOPAUSE   Osteoporosis is a disease in which the bones lose minerals and strength with aging. This can result  in serious bone fractures. Your risk for osteoporosis can be identified using a bone density scan.  If you are 71 years of age or older, or if you are at risk for osteoporosis and fractures, ask your health care provider if you should be screened.  Ask your health care provider whether you should take a calcium or vitamin D supplement to lower your risk for osteoporosis.  Menopause may have certain physical symptoms and risks.  Hormone replacement therapy may reduce some of these symptoms and risks. Talk to your health care provider about whether hormone replacement therapy is right for you.  HOME CARE INSTRUCTIONS   Schedule regular health, dental, and eye exams.  Stay current with your immunizations.   Do not use any tobacco products including cigarettes, chewing tobacco, or electronic cigarettes.  If you are pregnant, do not drink alcohol.  If you are breastfeeding, limit how much and how often you drink alcohol.  Limit alcohol intake to no more than 1 drink per day for nonpregnant women. One drink equals 12 ounces of beer, 5 ounces of wine, or 1 ounces of hard liquor.  Do not use street drugs.  Do not share needles.  Ask your health care provider for help if you need support or information about quitting drugs.  Tell your health care provider if you often feel depressed.  Tell your health care provider if you have ever been abused or do not feel safe at home.   This information is not intended to replace advice given to you by your health care provider. Make sure you discuss any questions you have with your health care provider.   Document Released: 06/03/2011 Document Revised: 12/09/2014 Document Reviewed: 10/20/2013 Elsevier Interactive Patient Education 2016 [place] K. Panosh M.D.

## 2015-11-08 LAB — CYTOLOGY - PAP

## 2015-11-08 NOTE — Progress Notes (Signed)
Quick Note:  Tell patient PAP is normal. HPV high risk is negative ______ 

## 2015-11-09 NOTE — Telephone Encounter (Signed)
Sent to the pharmacy by e-scribe. 

## 2016-07-29 ENCOUNTER — Other Ambulatory Visit: Payer: Self-pay | Admitting: Internal Medicine

## 2016-07-31 ENCOUNTER — Telehealth: Payer: Self-pay | Admitting: Family Medicine

## 2016-07-31 ENCOUNTER — Other Ambulatory Visit: Payer: Self-pay | Admitting: Family Medicine

## 2016-07-31 DIAGNOSIS — Z0000 Encounter for general adult medical examination without abnormal findings: Secondary | ICD-10-CM

## 2016-07-31 NOTE — Telephone Encounter (Signed)
Sent to the pharmacy by e-scribe.  Pt due for lab work and cpx in Dec.  Message sent to scheduling.

## 2016-07-31 NOTE — Telephone Encounter (Signed)
Pt is due for cpx and lab work in Dec.  I have placed the lab orders.  Please help the pt to make both appointments.  Thanks!!!

## 2016-08-01 NOTE — Telephone Encounter (Signed)
Pt has been sch

## 2016-12-04 ENCOUNTER — Encounter (HOSPITAL_COMMUNITY): Payer: Self-pay | Admitting: *Deleted

## 2016-12-08 NOTE — Progress Notes (Signed)
Pre visit review using our clinic review tool, if applicable. No additional management support is needed unless otherwise documented below in the visit note.  Chief Complaint  Patient presents with  . Surgical Clearance    Left hip replacement on the 30th.    HPI: Patient  Denise Garrett  66 y.o. comes in today for pre op evaluation  visit  To ha tka   Jan 30  Dr Alvan Dame  Last PC [id number]   She has arthritis in her hip and her symptoms are that her leg  Throbs 24 /7  Had  Right  tka  In past and has done well with that surgery .  No cp sbpo and no bleedin g No med except  thyroid replacement. No history of heart attack stroke recent cardiovascular pulmonary symptoms such as chest pain shortness of breath. No history of unusual bleeding blood in stool blood in urine. She will plan rehabilitation at home after she is discharged from the hospital. Requested flu vaccine today.    Health Maintenance  Topic Date Due  . MAMMOGRAM  08/18/2016  . HIV Screening  12/08/2017 (Originally 03/12/1966)  . COLONOSCOPY  01/18/2018  . PAP SMEAR  11/06/2018  . TETANUS/TDAP  09/25/2020  . INFLUENZA VACCINE  Completed  . ZOSTAVAX  Completed  . Hepatitis C Screening  Completed   Health Maintenance Review LIFESTYLE:  Exercise:   Tobacco/ETS: no Alcohol:  2-3 per week  Sugar beverages: Sleep: dec from pain  4   Taking 800 mg  Antiinflammatory   Minimal help  Drug use: no HH of  2 pet dog    ROS:  GEN/ HEENT: No fever, significant weight changes sweats headaches vision problems hearing changes,Did have ongoing nausea when she was taking ibuprofen or other pain medicines on a regular basis. CV/ PULM; No chest pain shortness of breath cough, syncope,edema  change in exercise tolerance. GI /GU: No adominal pain, vomiting, change in bowel habits. No blood in the stool. No significant GU symptoms. SKIN/HEME: ,no acute skin rashes suspicious lesions or bleeding. No lymphadenopathy, nodules, masses.    NEURO/ PSYCH:  No neurologic signs such as weakness numbness. No depression anxiety. IMM/ Allergy: No unusual infections.  Allergy .   REST of 12 system review negative except as per HPI Occasional use of the Ambien when she travels.   Past Medical History:  Diagnosis Date  . Anemia   . Arthritis   . Hx of colonic polyps   . Hypothyroidism   . Trigger finger    left ring finger    Past Surgical History:  Procedure Laterality Date  . bunion removed    . COLONOSCOPY    . EXPLORATORY LAPAROTOMY    . POLYPECTOMY    . TOTAL HIP ARTHROPLASTY Right 10/10/2015   Procedure: TOTAL RIGHT HIP ARTHROPLASTY ANTERIOR APPROACH;  Surgeon: Paralee Cancel, MD;  Location: [facility];  Service: Orthopedics;  Laterality: Right;  . WISDOM TOOTH EXTRACTION      Family History  Problem Relation Age of Onset  . Heart attack Father   . Coronary artery disease Father   . Coronary artery disease Brother     quad bypass age 96  . Colon cancer Neg Hx   . Esophageal cancer Neg Hx   . Prostate cancer Neg Hx   . Rectal cancer Neg Hx     Social History   Social History  . Marital status: Married    Spouse name: N/A  . Number  of children: 1  . Years of education: N/A   Occupational History  .  [employer]   Social History Main Topics  . Smoking status: Never Smoker  . Smokeless tobacco: Never Used  . Alcohol use 4.2 oz/week    7 Glasses of wine per week     Comment: occasional  . Drug use: No  . Sexual activity: Not Asked   Other Topics Concern  . 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   Considering  retire ing in a couple years.   [place] sleep interrupted  With late night noise    Outpatient Medications Prior to Visit  Medication Sig Dispense Refill  . levothyroxine (SYNTHROID, LEVOTHROID) 88 MCG tablet TAKE 1 TABLET DAILY 90 tablet 0   . zolpidem (AMBIEN) 5 MG tablet take 1 tablet by mouth nightly at bedtime as needed for sleep. (Patient not taking: Reported on 12/09/2016) 15 tablet 0  . acetaminophen (TYLENOL) 500 MG tablet Take 2 tablets (1,000 mg total) by mouth every 8 (eight) hours. 30 tablet 0  . Ibuprofen (ADVIL PO) Take by mouth.    . Vitamin D, Cholecalciferol, 1000 UNITS CAPS Take 1 capsule by mouth daily.    . Zolpidem Tartrate (AMBIEN PO) Take by mouth.     No facility-administered medications prior to visit.      EXAM:  BP 136/82 (BP Location: Left Arm, Patient Position: Sitting, Cuff Size: Normal)   Temp 98.1 F (36.7 C) (Oral)   Wt 169 lb 12.8 oz (77 kg)   BMI 28.26 kg/m   Body mass index is 28.26 kg/m.  Physical Exam: Vital signs reviewed [id number] is a well-developed well-nourished alert cooperative    who appearsr stated age in no acute distress.  HEENT: normocephalic atraumatic , Eyes: PERRL EOM's full, conjunctiva clear, Nares: paten,t no deformity discharge or tenderness., Ears: no deformity. Mouth: clear OP, no lesions, edema.  Moist mucous membranes. Dentition in adequate repair. NECK: supple without masses, thyromegaly or bruits. CHEST/PULM:  Clear to auscultationbreath sounds equal no wheeze , rales or rhonchi. No chest wall deformities or tenderness. CV: PMI is nondisplaced, S1 S2 no gallops, murmurs, rubs. Peripheral pulses are full without delay.No JVD .  ABDOMEN: Bowel sounds normal nontender  No guard or rebound, no hepato splenomegal no CVA tenderness.  Extremtities:  No clubbing cyanosis or edema, no acute joint swelling or redness no focal atrophy NEURO:  Oriented x3, cranial nerves 3-12 appear to be intact, no obvious focal weakness,gait within normal limitsSKIN: No acute rashes normal turgor, color, no bruising or petechiae. PSYCH: Oriented, good eye contact, no obvious depression anxiety, cognition and judgment appear normal. LN: no cervical  adenopathy   BP Readings from Last  3 Encounters:  12/09/16 136/82  11/07/15 118/74  10/11/15 110/60   Wt Readings from Last 3 Encounters:  12/09/16 169 lb 12.8 oz (77 kg)  11/07/15 162 lb 8 oz (73.7 kg)  10/10/15 166 lb (75.3 kg)     ASSESSMENT AND PLAN:  Discussed the following assessment and plan:  Arthritis of left hip  Pre-operative clearance  Need for prophylactic vaccination and inoculation against influenza - Plan: Flu Vaccine QUAD 36+ mos PF IM (Fluarix & Fluzone Quad PF) No medical contraindications are high  risk situation. Should be able to proceed for elective surgery. Expectant management and patient is aware. CPX labs will be done for the end of the month. If practical any abdominal labs can be done preoperatively if appropriate. Flu vaccine today  Patient Care Team: Burnis Medin, MD as PCP - General Jola Baptist, DC (Chiropractic Medicine) Paralee Cancel, MD as Consulting Physician (Orthopedic Surgery) Patient Instructions  [facility] for [facility] .   will send in form to Dr Aurea Graff office.   Will be getting lab for check up w [place] later this month  Cbcdiffplt, lfts, bmp, lipid panel, TSH      Mariann Laster K. Marlet Korte M.D.

## 2016-12-09 ENCOUNTER — Ambulatory Visit (INDEPENDENT_AMBULATORY_CARE_PROVIDER_SITE_OTHER): Payer: BC Managed Care – PPO | Admitting: Internal Medicine

## 2016-12-09 ENCOUNTER — Encounter: Payer: Self-pay | Admitting: Internal Medicine

## 2016-12-09 VITALS — BP 136/82 | Temp 98.1°F | Wt 169.8 lb

## 2016-12-09 DIAGNOSIS — M1612 Unilateral primary osteoarthritis, left hip: Secondary | ICD-10-CM | POA: Diagnosis not present

## 2016-12-09 DIAGNOSIS — Z01818 Encounter for other preprocedural examination: Secondary | ICD-10-CM

## 2016-12-09 DIAGNOSIS — Z23 Encounter for immunization: Secondary | ICD-10-CM

## 2016-12-09 NOTE — Patient Instructions (Signed)
[facility] for surgery .   will send in form to Dr Aurea Graff office.   Will be getting lab for check up w [place] later this month  Cbcdiffplt, lfts, bmp, lipid panel, TSH

## 2016-12-18 NOTE — H&P (Signed)
TOTAL HIP ADMISSION H&P  Patient is admitted for left total hip arthroplasty, anterior approach.  Subjective:  Chief Complaint:   Left hip primary OA / pain  HPI: Denise Garrett, 66 y.o. female, has a history of pain and functional disability in the left hip(s) due to arthritis and patient has failed non-surgical conservative treatments for greater than 12 weeks to include NSAID's and/or analgesics, corticosteriod injections and activity modification.  Onset of symptoms was gradual starting ~1 years ago with gradually worsening course since that time.The patient noted prior procedures of the hip to include arthroplasty on the right hip in August 2016.  Patient currently rates pain in the left hip at 8 out of 10 with activity. Patient has night pain, worsening of pain with activity and weight bearing, trendelenberg gait, pain that interfers with activities of daily living and pain with passive range of motion. Patient has evidence of periarticular osteophytes and joint space narrowing by imaging studies. This condition presents safety issues increasing the risk of falls.  There is no current active infection.   Risks, benefits and expectations were discussed with the patient.  Risks including but not limited to the risk of anesthesia, blood clots, nerve damage, blood vessel damage, failure of the prosthesis, infection and up to and including death.  Patient understand the risks, benefits and expectations and wishes to proceed with surgery.   PCP: Lottie Dawson, MD  D/C Plans:       Home   Post-op Meds:       No Rx given  Tranexamic Acid:      To be given - IV   Decadron:      Is to be given  FYI:     ASA  Tramadol and APAP  Morphine (prefered by the pt)    Patient Active Problem List   Diagnosis Date Noted  . Overweight (BMI 25.0-29.9) 10/11/2015  . S/P right THA, AA 10/10/2015  . Family history of heart disease 10/26/2014  . Groin swelling 12/03/2013  . Lt groin pain  12/03/2013  . Abnormal LFTs 10/07/2013  . Hair loss 11/09/2012  . Sleep disturbance 11/09/2012  . Abdominal pain, other specified site 11/09/2012  . Constipation 07/04/2012  . Visit for preventive health examination 02/14/2012  . Routine gynecological examination 02/14/2012  . Hypothyroidism 11/03/2007  . ALLERGIC RHINITIS 11/03/2007  . ARTHRITIS 11/03/2007  . COLONIC POLYPS, HX OF 11/03/2007   Past Medical History:  Diagnosis Date  . Anemia   . Arthritis   . Hx of colonic polyps   . Hypothyroidism   . Trigger finger    left ring finger    Past Surgical History:  Procedure Laterality Date  . bunion removed    . COLONOSCOPY    . EXPLORATORY LAPAROTOMY    . POLYPECTOMY    . TOTAL HIP ARTHROPLASTY Right 10/10/2015   Procedure: TOTAL RIGHT HIP ARTHROPLASTY ANTERIOR APPROACH;  Surgeon: Paralee Cancel, MD;  Location: [facility];  Service: Orthopedics;  Laterality: Right;  . WISDOM TOOTH EXTRACTION      No prescriptions prior to admission.   Allergies  Allergen Reactions  . Codeine Nausea And Vomiting    Social History  Substance Use Topics  . Smoking status: Never Smoker  . Smokeless tobacco: Never Used  . Alcohol use 4.2 oz/week    7 Glasses of wine per week     Comment: occasional    Family History  Problem Relation Age of Onset  . Heart attack Father   .  Coronary artery disease Father   . Coronary artery disease Brother     quad bypass age 49  . Colon cancer Neg Hx   . Esophageal cancer Neg Hx   . Prostate cancer Neg Hx   . Rectal cancer Neg Hx      Review of Systems  Constitutional: Negative.   HENT: Negative.   Eyes: Negative.   Respiratory: Negative.   Cardiovascular: Negative.   Gastrointestinal: Negative.   Genitourinary: Negative.   Musculoskeletal: Positive for joint pain.  Skin: Negative.   Neurological: Negative.   Endo/Heme/Allergies: Positive for environmental allergies.  Psychiatric/Behavioral: Negative.     Objective:  Physical Exam   Constitutional: She is oriented to person, place, and time. She appears well-developed.  HENT:  Head: Normocephalic.  Eyes: Pupils are equal, round, and reactive to light.  Neck: Neck supple. No JVD present. No tracheal deviation present. No thyromegaly present.  Cardiovascular: Normal rate, regular rhythm, normal heart sounds and intact distal pulses.   Respiratory: Effort normal and breath sounds normal. No stridor. No respiratory distress. She has no wheezes.  GI: Soft. There is no tenderness. There is no guarding.  Musculoskeletal:       Left hip: She exhibits decreased range of motion, decreased strength, tenderness and bony tenderness. She exhibits no swelling, no deformity and no laceration.  Lymphadenopathy:    She has no cervical adenopathy.  Neurological: She is alert and oriented to person, place, and time.  Skin: Skin is warm and dry.  Psychiatric: She has a normal mood and affect.      Labs:  Estimated body mass index is 28.26 kg/m as calculated from the following:   Height as of 11/07/15: 5\' 5"  (1.651 m).   Weight as of 12/09/16: 77 kg (169 lb 12.8 oz).   Imaging Review Plain radiographs demonstrate severe degenerative joint disease of the left hip(s). The bone quality appears to be good for age and reported activity level.  Assessment/Plan:  End stage arthritis, left hip(s)  The patient history, physical examination, clinical judgement of the provider and imaging studies are consistent with end stage degenerative joint disease of the left hip(s) and total hip arthroplasty is deemed medically necessary. The treatment options including medical management, injection therapy, arthroscopy and arthroplasty were discussed at length. The risks and benefits of total hip arthroplasty were presented and reviewed. The risks due to aseptic loosening, infection, stiffness, dislocation/subluxation,  thromboembolic complications and other imponderables were discussed.  The patient  acknowledged the explanation, agreed to proceed with the plan and consent was signed. Patient is being admitted for inpatient treatment for surgery, pain control, PT, OT, prophylactic antibiotics, VTE prophylaxis, progressive ambulation and ADL's and discharge planning.The patient is planning to be discharged home.      [place] Martita Brumm   PA-C  12/18/2016, 1:02 PM

## 2016-12-23 ENCOUNTER — Other Ambulatory Visit (INDEPENDENT_AMBULATORY_CARE_PROVIDER_SITE_OTHER): Payer: BC Managed Care – PPO

## 2016-12-23 DIAGNOSIS — Z0000 Encounter for general adult medical examination without abnormal findings: Secondary | ICD-10-CM | POA: Diagnosis not present

## 2016-12-23 LAB — BASIC METABOLIC PANEL
BUN: 21 mg/dL (ref 6–23)
CO2: 29 mEq/L (ref 19–32)
Calcium: 10 mg/dL (ref 8.4–10.5)
Chloride: 104 mEq/L (ref 96–112)
Creatinine, Ser: 0.99 mg/dL (ref 0.40–1.20)
GFR: 59.87 mL/min — AB (ref >60.00)
GLUCOSE: 91 mg/dL (ref 70–99)
POTASSIUM: 4.6 meq/L (ref 3.5–5.1)
SODIUM: 141 meq/L (ref 135–145)

## 2016-12-23 LAB — CBC WITH DIFFERENTIAL/PLATELET
Basophils Absolute: 0 10*3/uL (ref 0.0–0.1)
Basophils Relative: 0.7 % (ref 0.0–3.0)
EOS PCT: 3.3 % (ref 0.0–5.0)
Eosinophils Absolute: 0.2 10*3/uL (ref 0.0–0.7)
HCT: 44.2 % (ref 36.0–46.0)
HEMOGLOBIN: 15 g/dL (ref 12.0–15.0)
LYMPHS ABS: 1.4 10*3/uL (ref 0.7–4.0)
Lymphocytes Relative: 29.1 % (ref 12.0–46.0)
MCHC: 34 g/dL (ref 30.0–36.0)
MCV: 86.6 fl (ref 78.0–100.0)
Monocytes Absolute: 0.4 10*3/uL (ref 0.1–1.0)
Monocytes Relative: 7.8 % (ref 3.0–12.0)
Neutro Abs: 2.8 10*3/uL (ref 1.4–7.7)
Neutrophils Relative %: 59.1 % (ref 43.0–77.0)
Platelets: 258 10*3/uL (ref 150.0–400.0)
RBC: 5.11 Mil/uL (ref 3.87–5.11)
RDW: 12.8 % (ref 11.5–15.5)
WBC: 4.7 10*3/uL (ref 4.0–10.5)

## 2016-12-23 LAB — HEPATIC FUNCTION PANEL
ALBUMIN: 4.2 g/dL (ref 3.5–5.2)
ALT: 12 U/L (ref 0–35)
AST: 10 U/L (ref 0–37)
Alkaline Phosphatase: 61 U/L (ref 39–117)
Bilirubin, Direct: 0.1 mg/dL (ref 0.0–0.3)
Total Bilirubin: 0.6 mg/dL (ref 0.2–1.2)
Total Protein: 6.8 g/dL (ref 6.0–8.3)

## 2016-12-23 LAB — LIPID PANEL
Cholesterol: 178 mg/dL (ref 0–200)
HDL: 38.8 mg/dL — ABNORMAL LOW (ref >39.00)
LDL CALC: 125 mg/dL — AB (ref 0–99)
NONHDL: 138.95
Total CHOL/HDL Ratio: 5
Triglycerides: 72 mg/dL (ref 0.0–149.0)
VLDL: 14.4 mg/dL (ref 0.0–40.0)

## 2016-12-23 LAB — TSH: TSH: 0.65 u[IU]/mL (ref 0.35–4.50)

## 2016-12-25 ENCOUNTER — Other Ambulatory Visit (HOSPITAL_COMMUNITY): Payer: Self-pay | Admitting: *Deleted

## 2016-12-25 NOTE — Progress Notes (Signed)
MEDICAL CLEARANCE NOTE DR [facility] 12-09-16 ON CHART LABS ALL DONE 12-23-16 CBC WITH DIF, BMET, LIPID PANEL, TSH, HEPATIC FUNCTION PANEL IN EPIC

## 2016-12-25 NOTE — Patient Instructions (Signed)
Denise Garrett  12/25/2016   Your procedure is scheduled on: 12-31-16  Report to [facility] Main  Entrance take [facility]  elevators to 3rd floor to  [place] at 600 AM.  Call this number if you have problems the morning of surgery [phone]   Remember: ONLY 1 PERSON MAY GO WITH YOU TO SHORT STAY TO GET  READY MORNING OF [place].  Do not eat food or drink liquids :After Midnight.     Take these medicines the morning of surgery with A SIP OF WATER: LEVOTHYROXINE (SNYTHROID), SYSTANE EYE DROP              You may not have any metal on your body including hair pins and              piercings  Do not wear jewelry, make-up, lotions, powders or perfumes, deodorant             Do not wear nail polish.  Do not shave  48 hours prior to surgery.               Do not bring valuables to the hospital. [place].  Contacts, dentures or bridgework may not be worn into surgery.  Leave suitcase in the car. After surgery it may be brought to your room.                  Please read over the following fact sheets you were given: _____________________________________________________________________             [facility] - Preparing for Surgery Before surgery, you can play an important role.  Because skin is not sterile, your skin needs to be as free of germs as possible.  You can reduce the number of germs on your skin by washing with CHG (chlorahexidine gluconate) soap before surgery.  CHG is an antiseptic cleaner which kills germs and bonds with the skin to continue killing germs even after washing. Please DO NOT use if you have an allergy to CHG or antibacterial soaps.  If your skin becomes reddened/irritated stop using the CHG and inform your nurse when you arrive at Short Stay. Do not shave (including legs and underarms) for at least 48 hours prior to the first CHG shower.  You may shave your face/neck. Please  follow these instructions carefully:  1.  Shower with CHG Soap the night before surgery and the  morning of Surgery.  2.  If you choose to wash your hair, wash your hair first as usual with your  normal  shampoo.  3.  After you shampoo, rinse your hair and body thoroughly to remove the  shampoo.                           4.  Use CHG as you would any other liquid soap.  You can apply chg directly  to the skin and wash                       Gently with a scrungie or clean washcloth.  5.  Apply the CHG Soap to your body ONLY FROM THE NECK DOWN.   Do not use on face/ open  Wound or open sores. Avoid contact with eyes, ears mouth and genitals (private parts).                       Wash face,  Genitals (private parts) with your normal soap.             6.  Wash thoroughly, paying special attention to the area where your surgery  will be performed.  7.  Thoroughly rinse your body with warm water from the neck down.  8.  DO NOT shower/wash with your normal soap after using and rinsing off  the CHG Soap.                9.  Pat yourself dry with a clean towel.            10.  Wear clean pajamas.            11.  Place clean sheets on your bed the night of your first shower and do not  sleep with pets. Day of Surgery : Do not apply any lotions/deodorants the morning of surgery.  Please wear clean clothes to the hospital/surgery center.  FAILURE TO FOLLOW THESE INSTRUCTIONS MAY RESULT IN THE CANCELLATION OF YOUR SURGERY PATIENT SIGNATURE_________________________________  NURSE SIGNATURE__________________________________  ________________________________________________________________________   [name]  An incentive spirometer is a tool that can help keep your lungs clear and active. This tool measures how well you are filling your lungs with each breath. Taking long deep breaths may help reverse or decrease the chance of developing breathing (pulmonary) problems  (especially infection) following:  A long period of time when you are unable to move or be active. BEFORE THE PROCEDURE   If the spirometer includes an indicator to show your best effort, your nurse or respiratory therapist will set it to a desired goal.  If possible, sit up straight or lean slightly forward. Try not to slouch.  Hold the incentive spirometer in an upright position. INSTRUCTIONS FOR USE  1. Sit on the edge of your bed if possible, or sit up as far as you can in bed or on a chair. 2. Hold the incentive spirometer in an upright position. 3. Breathe out normally. 4. Place the mouthpiece in your mouth and seal your lips tightly around it. 5. Breathe in slowly and as deeply as possible, raising the piston or the ball toward the top of the column. 6. Hold your breath for 3-5 seconds or for as long as possible. Allow the piston or ball to fall to the bottom of the column. 7. Remove the mouthpiece from your mouth and breathe out normally. 8. Rest for a few seconds and repeat Steps 1 through 7 at least 10 times every 1-2 hours when you are awake. Take your time and take a few normal breaths between deep breaths. 9. The spirometer may include an indicator to show your best effort. Use the indicator as a goal to work toward during each repetition. 10. After each set of 10 deep breaths, practice coughing to be sure your lungs are clear. If you have an incision (the cut made at the time of surgery), support your incision when coughing by placing a pillow or rolled up towels firmly against it. Once you are able to get out of bed, walk around indoors and cough well. You may stop using the incentive spirometer when instructed by your caregiver.  RISKS AND COMPLICATIONS  Take your time so you do not get  dizzy or light-headed.  If you are in pain, you may need to take or ask for pain medication before doing incentive spirometry. It is harder to take a deep breath if you are having  pain. AFTER USE  Rest and breathe slowly and easily.  It can be helpful to keep track of a log of your progress. Your caregiver can provide you with a simple table to help with this. If you are using the spirometer at home, follow these instructions: [place] IF:   You are having difficultly using the spirometer.  You have trouble using the spirometer as often as instructed.  Your pain medication is not giving enough relief while using the spirometer.  You develop fever of 100.5 F (38.1 C) or higher. SEEK IMMEDIATE MEDICAL CARE IF:   You cough up bloody sputum that had not been present before.  You develop fever of 102 F (38.9 C) or greater.  You develop worsening pain at or near the incision site. MAKE SURE YOU:   Understand these instructions.  Will watch your condition.  Will get help right away if you are not doing well or get worse. Document Released: 03/31/2007 Document Revised: 02/10/2012 Document Reviewed: 06/01/2007 ExitCare Patient Information 2014 ExitCare, [place].   ________________________________________________________________________  WHAT IS A BLOOD TRANSFUSION? Blood Transfusion Information  A transfusion is the replacement of blood or some of its parts. Blood is made up of multiple cells which provide different functions.  Red blood cells carry oxygen and are used for blood loss replacement.  White blood cells fight against infection.  Platelets control bleeding.  Plasma helps clot blood.  Other blood products are available for specialized needs, such as hemophilia or other clotting disorders. BEFORE THE TRANSFUSION  Who gives blood for transfusions?   Healthy volunteers who are fully evaluated to make sure their blood is safe. This is blood bank blood. Transfusion therapy is the safest it has ever been in the practice of medicine. Before blood is taken from a donor, a complete history is taken to make sure that person has no history  of diseases nor engages in risky social behavior (examples are intravenous drug use or sexual activity with multiple partners). The donor's travel history is screened to minimize risk of transmitting infections, such as malaria. The donated blood is tested for signs of infectious diseases, such as HIV and hepatitis. The blood is then tested to be sure it is compatible with you in order to minimize the chance of a transfusion reaction. If you or a relative donates blood, this is often done in anticipation of surgery and is not appropriate for emergency situations. It takes many days to process the donated blood. RISKS AND COMPLICATIONS Although transfusion therapy is very safe and saves many lives, the main dangers of transfusion include:   Getting an infectious disease.  Developing a transfusion reaction. This is an allergic reaction to something in the blood you were given. Every precaution is taken to prevent this. The decision to have a blood transfusion has been considered carefully by your caregiver before blood is given. Blood is not given unless the benefits outweigh the risks. AFTER THE TRANSFUSION  Right after receiving a blood transfusion, you will usually feel much better and more energetic. This is especially true if your red blood cells have gotten low (anemic). The transfusion raises the level of the red blood cells which carry oxygen, and this usually causes an energy increase.  The nurse administering the transfusion will  monitor you carefully for complications. HOME CARE INSTRUCTIONS  No special instructions are needed after a transfusion. You may find your energy is better. Speak with your caregiver about any limitations on activity for underlying diseases you may have. SEEK MEDICAL CARE IF:   Your condition is not improving after your transfusion.  You develop redness or irritation at the intravenous (IV) site. SEEK IMMEDIATE MEDICAL CARE IF:  Any of the following symptoms  occur over the next 12 hours:  Shaking chills.  You have a temperature by mouth above 102 F (38.9 C), not controlled by medicine.  Chest, back, or muscle pain.  People around you feel you are not acting correctly or are confused.  Shortness of breath or difficulty breathing.  Dizziness and fainting.  You get a rash or develop hives.  You have a decrease in urine output.  Your urine turns a dark color or changes to pink, red, or brown. Any of the following symptoms occur over the next 10 days:  You have a temperature by mouth above 102 F (38.9 C), not controlled by medicine.  Shortness of breath.  Weakness after normal activity.  The white part of the eye turns yellow (jaundice).  You have a decrease in the amount of urine or are urinating less often.  Your urine turns a dark color or changes to pink, red, or brown. Document Released: 11/15/2000 Document Revised: 02/10/2012 Document Reviewed: 07/04/2008 [facility] Patient Information 2014 [place], [place].  _______________________________________________________________________

## 2016-12-27 ENCOUNTER — Encounter (HOSPITAL_COMMUNITY)
Admission: RE | Admit: 2016-12-27 | Discharge: 2016-12-27 | Disposition: A | Discharge status: Home / Self Care | Payer: BC Managed Care – PPO | Source: Ambulatory Visit | Attending: Orthopedic Surgery | Admitting: Orthopedic Surgery

## 2016-12-27 ENCOUNTER — Encounter (HOSPITAL_COMMUNITY): Payer: Self-pay

## 2016-12-27 DIAGNOSIS — Z01818 Encounter for other preprocedural examination: Secondary | ICD-10-CM | POA: Insufficient documentation

## 2016-12-27 LAB — SURGICAL PCR SCREEN
MRSA, PCR: NEGATIVE
Staphylococcus aureus: NEGATIVE

## 2016-12-29 ENCOUNTER — Other Ambulatory Visit: Payer: Self-pay | Admitting: Internal Medicine

## 2016-12-29 NOTE — Progress Notes (Signed)
Chief Complaint  Patient presents with  . Annual Exam    HPI: Patient  Denise Garrett  66 y.o. comes in today for Preventive Health Care visit  To have THA left  tomorrow  For left hip arthritis  Thyroid no change needs refull Rare use ambien 2.5 usu  About once a month if needed. Taking duex for now and plans less in future for hi  Health Maintenance  Topic Date Due  . MAMMOGRAM  08/18/2016  . HIV Screening  12/08/2017 (Originally 03/12/1966)  . COLONOSCOPY  01/18/2018  . PAP SMEAR  11/06/2018  . TETANUS/TDAP  09/25/2020  . INFLUENZA VACCINE  Completed  . ZOSTAVAX  Completed  . Hepatitis C Screening  Completed   Health Maintenance Review LIFESTYLE:  Exercise:   Limited by hip  Tobacco/ETS:n Alcohol: limited Sugar beverages:n Sleep: 3 hours  Pain interrupted  Drug use: no HH of  2 dog and cat  Work:n    ROS:  GEN/ HEENT: No fever, significant weight changes sweats headaches vision problems hearing changes, CV/ PULM; No chest pain shortness of breath cough, syncope,edema  change in exercise tolerance. GI /GU: No adominal pain, vomiting, change in bowel habits. No blood in the stool. No significant GU symptoms. SKIN/HEME: ,no acute skin rashes suspicious lesions or bleeding. No lymphadenopathy, nodules, masses.  NEURO/ PSYCH:  No neurologic signs such as weakness numbness. No depression anxiety. IMM/ Allergy: No unusual infections.  Allergy .   REST of 12 system review negative except as per HPI   Past Medical History:  Diagnosis Date  . Anemia    hx of   . Arthritis   . Hx of colonic polyps   . Hypothyroidism   . Trigger finger    left ring finger    Past Surgical History:  Procedure Laterality Date  . bunion removed    . COLONOSCOPY    . EXPLORATORY LAPAROTOMY    . POLYPECTOMY    . TOTAL HIP ARTHROPLASTY Right 10/10/2015   Procedure: TOTAL RIGHT HIP ARTHROPLASTY ANTERIOR APPROACH;  Surgeon: Paralee Cancel, MD;  Location: [facility];  Service: Orthopedics;   Laterality: Right;  . WISDOM TOOTH EXTRACTION      Family History  Problem Relation Age of Onset  . Heart attack Father   . Coronary artery disease Father   . Coronary artery disease Brother     quad bypass age 65  . Colon cancer Neg Hx   . Esophageal cancer Neg Hx   . Prostate cancer Neg Hx   . Rectal cancer Neg Hx     Social History   Social History  . Marital status: Married    Spouse name: N/A  . Number of children: 1  . Years of education: N/A   Occupational History  .  [employer]   Social History Main Topics  . Smoking status: Never Smoker  . Smokeless tobacco: Never Used  . Alcohol use Yes     Comment: social drinker, every weekend  . Drug use: No  . Sexual activity: Not Asked   Other Topics Concern  . 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   Considering  retire ing in a couple years.   [place] sleep interrupted  With  late night noise    Outpatient Medications Prior to Visit  Medication Sig Dispense Refill  . DUEXIS 800-26.6 MG TABS Take 1 tablet by mouth 2 (two) times daily as needed (pain).     . Magnesium 300 MG CAPS Take 300-600 mg by mouth daily. depends on constipation if takes 300-600 mg    . Polyethyl Glycol-Propyl Glycol (SYSTANE OP) Apply 1 drop to eye daily.    Marland Kitchen levothyroxine (SYNTHROID, LEVOTHROID) 88 MCG tablet TAKE 1 TABLET DAILY 90 tablet 0  . zolpidem (AMBIEN) 5 MG tablet take 1 tablet by mouth nightly at bedtime as needed for sleep. (Patient taking differently: Take 2.5 mg by mouth at bedtime as needed for sleep. ) 15 tablet 0   No facility-administered medications prior to visit.      EXAM:  BP 128/78   Pulse 82   Temp 98.3 F (36.8 C) (Oral)   Ht 5' 5.5" (1.664 m)   Wt 165 lb (74.8 kg)   SpO2 95%   BMI 27.04 kg/m   Body mass index is 27.04 kg/m. Wt Readings from Last 3  Encounters:  12/30/16 165 lb (74.8 kg)  12/27/16 164 lb 6.4 oz (74.6 kg)  12/09/16 169 lb 12.8 oz (77 kg)    Physical Exam: Vital signs reviewed [id number] is a well-developed well-nourished alert cooperative    who appearsr stated age in no acute distress.  HEENT: normocephalic atraumatic , Eyes: PERRL EOM's full, conjunctiva clear, Nares: paten,t no deformity discharge or tenderness., Ears: no deformity EAC's clear TMs with normal landmarks. Mouth: clear OP, no lesions, edema.  Moist mucous membranes. Dentition in adequate repair. NECK: supple without masses, thyromegaly or bruits. CHEST/PULM:  Clear to auscultation and percussion breath sounds equal no wheeze , rales or rhonchi. No chest wall deformities or tenderness. Breast: normal by inspection . No dimpling, discharge, masses, tenderness or discharge . CV: PMI is nondisplaced, S1 S2 no gallops, murmurs, rubs. Peripheral pulses are full without delay.No JVD .  ABDOMEN: Bowel sounds normal nontender  No guard or rebound, no hepato splenomegal no CVA tenderness.  No hernia. Extremtities:  No clubbing cyanosis or edema, no acute joint swelling or redness no focal atrophy NEURO:  Oriented x3, cranial nerves 3-12 appear to be intact, no obvious focal weakness,gait mildly antalgic  abnormal reflexes or asymmetrical SKIN: No acute rashes normal turgor, color, no bruising or petechiae. PSYCH: Oriented, good eye contact, no obvious depression anxiety, cognition and judgment appear normal. LN: no cervical axillary inguinal adenopathy  Lab Results  Component Value Date   WBC 4.7 12/23/2016   HGB 15.0 12/23/2016   HCT 44.2 12/23/2016   PLT 258.0 12/23/2016   GLUCOSE 91 12/23/2016   CHOL 178 12/23/2016   TRIG 72.0 12/23/2016   HDL 38.80 (L) 12/23/2016   LDLCALC 125 (H) 12/23/2016   ALT 12 12/23/2016   AST 10 12/23/2016   NA 141 12/23/2016   K 4.6 12/23/2016   CL 104 12/23/2016   CREATININE 0.99 12/23/2016   BUN 21 12/23/2016   CO2 29  12/23/2016   TSH 0.65 12/23/2016   INR 1.01 09/29/2015    BP Readings from Last 3 Encounters:  12/30/16 128/78  12/27/16 137/79  12/09/16 136/82    Lab results reviewed with patient   ASSESSMENT AND PLAN:  Discussed the following assessment and plan:  Visit for preventive health examination  Hypothyroidism, unspecified type  Family history of heart disease  Arthritis of left hip Yearly   Bmp limit  naids when better   Follow gfr.  Low hdl  lsi  yealry check  Get mammo when possible  Patient Care Team: Burnis Medin, MD as PCP - General Jola Baptist, DC (Chiropractic Medicine) Paralee Cancel, MD as Consulting Physician (Orthopedic Surgery) Patient Instructions  Good luck with the surgery . hopefully get back to some exercise that will help   Your cholesterol.  Continue thyroid medication. Get your mammogram when possible.    Healthy lifestyle includes : At least 150 minutes of exercise weeks  , weight at healthy levels, which is usually   BMI 19-25. Avoid trans fats and processed foods;  Increase fresh fruits and veges to 5 servings per day. And avoid sweet beverages including tea and juice. Mediterranean diet with olive oil and nuts have been noted to be heart and brain healthy . Avoid tobacco products . Limit  alcohol to  7 per week for women and 14 servings for men.  Get adequate sleep . Wear seat belts . Don't text and drive .     Standley Brooking. Datha Kissinger M.D.

## 2016-12-30 ENCOUNTER — Encounter: Payer: Self-pay | Admitting: Internal Medicine

## 2016-12-30 ENCOUNTER — Ambulatory Visit (INDEPENDENT_AMBULATORY_CARE_PROVIDER_SITE_OTHER): Payer: BC Managed Care – PPO | Admitting: Internal Medicine

## 2016-12-30 VITALS — BP 128/78 | HR 82 | Temp 98.3°F | Ht 65.5 in | Wt 165.0 lb

## 2016-12-30 DIAGNOSIS — E039 Hypothyroidism, unspecified: Secondary | ICD-10-CM | POA: Diagnosis not present

## 2016-12-30 DIAGNOSIS — Z0000 Encounter for general adult medical examination without abnormal findings: Secondary | ICD-10-CM | POA: Diagnosis not present

## 2016-12-30 DIAGNOSIS — Z8249 Family history of ischemic heart disease and other diseases of the circulatory system: Secondary | ICD-10-CM

## 2016-12-30 DIAGNOSIS — M1612 Unilateral primary osteoarthritis, left hip: Secondary | ICD-10-CM

## 2016-12-30 MED ORDER — ZOLPIDEM TARTRATE 5 MG PO TABS: 2.5000 mg | ORAL_TABLET | Freq: Every evening | ORAL | 0 refills | Status: DC | PRN

## 2016-12-30 MED ORDER — LEVOTHYROXINE SODIUM 88 MCG PO TABS: 88.0000 ug | ORAL_TABLET | Freq: Every day | ORAL | 3 refills | Status: DC

## 2016-12-30 NOTE — Patient Instructions (Signed)
Good luck with the surgery . hopefully get back to some exercise that will help   Your cholesterol.  Continue thyroid medication. Get your mammogram when possible.    Healthy lifestyle includes : At least 150 minutes of exercise weeks  , weight at healthy levels, which is usually   BMI 19-25. Avoid trans fats and processed foods;  Increase fresh fruits and veges to 5 servings per day. And avoid sweet beverages including tea and juice. Mediterranean diet with olive oil and nuts have been noted to be heart and brain healthy . Avoid tobacco products . Limit  alcohol to  7 per week for women and 14 servings for men.  Get adequate sleep . Wear seat belts . Don't text and drive .

## 2016-12-31 ENCOUNTER — Inpatient Hospital Stay (HOSPITAL_COMMUNITY): Payer: BC Managed Care – PPO

## 2016-12-31 ENCOUNTER — Inpatient Hospital Stay (HOSPITAL_COMMUNITY): Payer: BC Managed Care – PPO | Admitting: Anesthesiology

## 2016-12-31 ENCOUNTER — Encounter (HOSPITAL_COMMUNITY)
Admission: RE | Disposition: A | Discharge status: Home Health | Payer: Self-pay | Source: Ambulatory Visit | Attending: Orthopedic Surgery

## 2016-12-31 ENCOUNTER — Inpatient Hospital Stay (HOSPITAL_COMMUNITY)
Admission: RE | Admit: 2016-12-31 | Discharge: 2017-01-02 | DRG: 470 | Disposition: A | Discharge status: Home Health | Payer: BC Managed Care – PPO | Source: Ambulatory Visit | Attending: Orthopedic Surgery | Admitting: Orthopedic Surgery

## 2016-12-31 ENCOUNTER — Encounter (HOSPITAL_COMMUNITY): Payer: Self-pay | Admitting: Anesthesiology

## 2016-12-31 DIAGNOSIS — M1612 Unilateral primary osteoarthritis, left hip: Secondary | ICD-10-CM | POA: Diagnosis present

## 2016-12-31 DIAGNOSIS — R110 Nausea: Secondary | ICD-10-CM | POA: Diagnosis not present

## 2016-12-31 DIAGNOSIS — M25552 Pain in left hip: Secondary | ICD-10-CM | POA: Diagnosis present

## 2016-12-31 DIAGNOSIS — Z96649 Presence of unspecified artificial hip joint: Secondary | ICD-10-CM

## 2016-12-31 DIAGNOSIS — Z96642 Presence of left artificial hip joint: Secondary | ICD-10-CM

## 2016-12-31 DIAGNOSIS — E860 Dehydration: Secondary | ICD-10-CM | POA: Diagnosis not present

## 2016-12-31 DIAGNOSIS — E039 Hypothyroidism, unspecified: Secondary | ICD-10-CM | POA: Diagnosis present

## 2016-12-31 DIAGNOSIS — Z96641 Presence of right artificial hip joint: Secondary | ICD-10-CM

## 2016-12-31 DIAGNOSIS — R2681 Unsteadiness on feet: Secondary | ICD-10-CM

## 2016-12-31 DIAGNOSIS — E663 Overweight: Secondary | ICD-10-CM | POA: Diagnosis present

## 2016-12-31 HISTORY — PX: TOTAL HIP ARTHROPLASTY: SHX124

## 2016-12-31 LAB — TYPE AND SCREEN
ABO/RH(D): A POS
ANTIBODY SCREEN: NEGATIVE

## 2016-12-31 SURGERY — ARTHROPLASTY, HIP, TOTAL, ANTERIOR APPROACH
Anesthesia: Spinal | Site: Hip | Laterality: Left

## 2016-12-31 MED ORDER — METHOCARBAMOL 500 MG PO TABS
500.0000 mg | ORAL_TABLET | Freq: Four times a day (QID) | ORAL | Status: DC | PRN
  Administered 2016-12-31 – 2017-01-02 (×5): 500 mg via ORAL
  Filled 2016-12-31 (×6): qty 1

## 2016-12-31 MED ORDER — ALUM & MAG HYDROXIDE-SIMETH 200-200-20 MG/5ML PO SUSP
30.0000 mL | ORAL | Status: DC | PRN
  Administered 2017-01-01: 18:00:00 30 mL via ORAL
  Filled 2016-12-31: qty 30

## 2016-12-31 MED ORDER — CEFAZOLIN SODIUM-DEXTROSE 2-4 GM/100ML-% IV SOLN
INTRAVENOUS | Status: AC
  Filled 2016-12-31: qty 100

## 2016-12-31 MED ORDER — POLYETHYLENE GLYCOL 3350 17 G PO PACK
17.0000 g | PACK | Freq: Two times a day (BID) | ORAL | Status: DC
  Administered 2016-12-31 – 2017-01-01 (×3): 17 g via ORAL
  Filled 2016-12-31 (×4): qty 1

## 2016-12-31 MED ORDER — POLYETHYLENE GLYCOL 3350 17 G PO PACK: 17.0000 g | PACK | Freq: Every day | ORAL | 0 refills | Status: DC

## 2016-12-31 MED ORDER — TRANEXAMIC ACID 1000 MG/10ML IV SOLN
1000.0000 mg | INTRAVENOUS | Status: AC
  Administered 2016-12-31: 1000 mg via INTRAVENOUS
  Filled 2016-12-31: qty 1100

## 2016-12-31 MED ORDER — MAGNESIUM CITRATE PO SOLN: 1.0000 | Freq: Once | ORAL | Status: DC | PRN

## 2016-12-31 MED ORDER — DOCUSATE SODIUM 100 MG PO CAPS
100.0000 mg | ORAL_CAPSULE | Freq: Two times a day (BID) | ORAL | Status: DC
  Administered 2016-12-31 – 2017-01-01 (×3): 100 mg via ORAL
  Filled 2016-12-31 (×4): qty 1

## 2016-12-31 MED ORDER — CHLORHEXIDINE GLUCONATE 4 % EX LIQD: 60.0000 mL | Freq: Once | CUTANEOUS | Status: DC

## 2016-12-31 MED ORDER — LACTATED RINGERS IV SOLN
INTRAVENOUS | Status: DC
  Administered 2016-12-31 (×2): via INTRAVENOUS

## 2016-12-31 MED ORDER — BUPIVACAINE IN DEXTROSE 0.75-8.25 % IT SOLN
INTRATHECAL | Status: DC | PRN
  Administered 2016-12-31: 1.8 mL via INTRATHECAL

## 2016-12-31 MED ORDER — MIDAZOLAM HCL 2 MG/2ML IJ SOLN
INTRAMUSCULAR | Status: AC
  Filled 2016-12-31: qty 2

## 2016-12-31 MED ORDER — ONDANSETRON HCL 4 MG/2ML IJ SOLN
INTRAMUSCULAR | Status: AC
  Filled 2016-12-31: qty 2

## 2016-12-31 MED ORDER — ONDANSETRON HCL 4 MG PO TABS: 4.0000 mg | ORAL_TABLET | Freq: Four times a day (QID) | ORAL | Status: DC | PRN

## 2016-12-31 MED ORDER — PROPOFOL 10 MG/ML IV BOLUS
INTRAVENOUS | Status: AC
  Filled 2016-12-31: qty 60

## 2016-12-31 MED ORDER — PHENOL 1.4 % MT LIQD
1.0000 | OROMUCOSAL | Status: DC | PRN
  Filled 2016-12-31: qty 177

## 2016-12-31 MED ORDER — DEXAMETHASONE SODIUM PHOSPHATE 10 MG/ML IJ SOLN
INTRAMUSCULAR | Status: AC
  Filled 2016-12-31: qty 1

## 2016-12-31 MED ORDER — METHOCARBAMOL 500 MG PO TABS: 500.0000 mg | ORAL_TABLET | Freq: Four times a day (QID) | ORAL | 0 refills | Status: DC | PRN

## 2016-12-31 MED ORDER — PHENYLEPHRINE HCL 10 MG/ML IJ SOLN
INTRAVENOUS | Status: DC | PRN
  Administered 2016-12-31: 40 ug/min via INTRAVENOUS

## 2016-12-31 MED ORDER — TRAMADOL HCL 50 MG PO TABS: 50.0000 mg | ORAL_TABLET | Freq: Four times a day (QID) | ORAL | 0 refills | Status: DC | PRN

## 2016-12-31 MED ORDER — CELECOXIB 200 MG PO CAPS
200.0000 mg | ORAL_CAPSULE | Freq: Two times a day (BID) | ORAL | Status: DC
  Administered 2016-12-31 – 2017-01-02 (×4): 200 mg via ORAL
  Filled 2016-12-31 (×4): qty 1

## 2016-12-31 MED ORDER — PHENYLEPHRINE HCL 10 MG/ML IJ SOLN: 30.0000 ug/min | INTRAVENOUS | Status: DC

## 2016-12-31 MED ORDER — MEPERIDINE HCL 50 MG/ML IJ SOLN: 6.2500 mg | INTRAMUSCULAR | Status: DC | PRN

## 2016-12-31 MED ORDER — ONDANSETRON HCL 4 MG/2ML IJ SOLN
INTRAMUSCULAR | Status: DC | PRN
  Administered 2016-12-31: 4 mg via INTRAVENOUS

## 2016-12-31 MED ORDER — DEXAMETHASONE SODIUM PHOSPHATE 10 MG/ML IJ SOLN
10.0000 mg | Freq: Once | INTRAMUSCULAR | Status: AC
  Administered 2016-12-31: 10 mg via INTRAVENOUS

## 2016-12-31 MED ORDER — FERROUS SULFATE 325 (65 FE) MG PO TABS: 325.0000 mg | ORAL_TABLET | Freq: Three times a day (TID) | ORAL | Status: DC

## 2016-12-31 MED ORDER — DIPHENHYDRAMINE HCL 25 MG PO CAPS: 25.0000 mg | ORAL_CAPSULE | Freq: Four times a day (QID) | ORAL | Status: DC | PRN

## 2016-12-31 MED ORDER — SODIUM CHLORIDE 0.9 % IV SOLN
INTRAVENOUS | Status: DC
  Administered 2016-12-31 – 2017-01-01 (×3): via INTRAVENOUS

## 2016-12-31 MED ORDER — MENTHOL 3 MG MT LOZG: 1.0000 | LOZENGE | OROMUCOSAL | Status: DC | PRN

## 2016-12-31 MED ORDER — ACETAMINOPHEN 500 MG PO TABS: 1000.0000 mg | ORAL_TABLET | Freq: Three times a day (TID) | ORAL | 0 refills | Status: DC

## 2016-12-31 MED ORDER — FERROUS SULFATE 325 (65 FE) MG PO TABS
325.0000 mg | ORAL_TABLET | Freq: Three times a day (TID) | ORAL | Status: DC
  Administered 2016-12-31: 325 mg via ORAL
  Filled 2016-12-31 (×2): qty 1

## 2016-12-31 MED ORDER — ACETAMINOPHEN 500 MG PO TABS
1000.0000 mg | ORAL_TABLET | Freq: Three times a day (TID) | ORAL | Status: DC
  Administered 2016-12-31 – 2017-01-02 (×5): 1000 mg via ORAL
  Filled 2016-12-31 (×5): qty 2

## 2016-12-31 MED ORDER — ZOLPIDEM TARTRATE 5 MG PO TABS: 2.5000 mg | ORAL_TABLET | Freq: Every evening | ORAL | Status: DC | PRN

## 2016-12-31 MED ORDER — FENTANYL CITRATE (PF) 100 MCG/2ML IJ SOLN: 25.0000 ug | INTRAMUSCULAR | Status: DC | PRN

## 2016-12-31 MED ORDER — DOCUSATE SODIUM 100 MG PO CAPS: 100.0000 mg | ORAL_CAPSULE | Freq: Two times a day (BID) | ORAL | 0 refills | Status: DC

## 2016-12-31 MED ORDER — SODIUM CHLORIDE 0.9 % IR SOLN
Status: DC | PRN
  Administered 2016-12-31: 1000 mL

## 2016-12-31 MED ORDER — PROPOFOL 500 MG/50ML IV EMUL
INTRAVENOUS | Status: DC | PRN
  Administered 2016-12-31: 50 ug/kg/min via INTRAVENOUS

## 2016-12-31 MED ORDER — CEFAZOLIN SODIUM-DEXTROSE 2-4 GM/100ML-% IV SOLN
2.0000 g | Freq: Four times a day (QID) | INTRAVENOUS | Status: AC
  Administered 2016-12-31 (×2): 2 g via INTRAVENOUS
  Filled 2016-12-31 (×2): qty 100

## 2016-12-31 MED ORDER — PROMETHAZINE HCL 25 MG/ML IJ SOLN: 6.2500 mg | INTRAMUSCULAR | Status: DC | PRN

## 2016-12-31 MED ORDER — PHENYLEPHRINE HCL 10 MG/ML IJ SOLN
INTRAMUSCULAR | Status: AC
  Filled 2016-12-31: qty 1

## 2016-12-31 MED ORDER — KETOROLAC TROMETHAMINE 30 MG/ML IJ SOLN: 30.0000 mg | Freq: Once | INTRAMUSCULAR | Status: DC

## 2016-12-31 MED ORDER — DEXAMETHASONE SODIUM PHOSPHATE 10 MG/ML IJ SOLN
10.0000 mg | Freq: Once | INTRAMUSCULAR | Status: AC
  Administered 2017-01-01: 10 mg via INTRAVENOUS
  Filled 2016-12-31: qty 1

## 2016-12-31 MED ORDER — TRAMADOL HCL 50 MG PO TABS
50.0000 mg | ORAL_TABLET | Freq: Four times a day (QID) | ORAL | Status: DC
  Administered 2016-12-31 – 2017-01-01 (×4): 100 mg via ORAL
  Administered 2017-01-02: 50 mg via ORAL
  Filled 2016-12-31: qty 1
  Filled 2016-12-31 (×8): qty 2

## 2016-12-31 MED ORDER — FENTANYL CITRATE (PF) 100 MCG/2ML IJ SOLN
INTRAMUSCULAR | Status: AC
  Filled 2016-12-31: qty 2

## 2016-12-31 MED ORDER — METHOCARBAMOL 1000 MG/10ML IJ SOLN
500.0000 mg | Freq: Four times a day (QID) | INTRAVENOUS | Status: DC | PRN
  Administered 2016-12-31: 500 mg via INTRAVENOUS
  Filled 2016-12-31: qty 5
  Filled 2016-12-31: qty 550

## 2016-12-31 MED ORDER — ASPIRIN 81 MG PO CHEW: 81.0000 mg | CHEWABLE_TABLET | Freq: Two times a day (BID) | ORAL | 0 refills | Status: AC

## 2016-12-31 MED ORDER — CEFAZOLIN SODIUM-DEXTROSE 2-4 GM/100ML-% IV SOLN
2.0000 g | INTRAVENOUS | Status: AC
  Administered 2016-12-31: 2 g via INTRAVENOUS

## 2016-12-31 MED ORDER — MORPHINE SULFATE (PF) 2 MG/ML IV SOLN
1.0000 mg | INTRAVENOUS | Status: DC | PRN
  Administered 2016-12-31 (×3): 2 mg via INTRAVENOUS
  Filled 2016-12-31 (×3): qty 1

## 2016-12-31 MED ORDER — ONDANSETRON HCL 4 MG/2ML IJ SOLN
4.0000 mg | Freq: Four times a day (QID) | INTRAMUSCULAR | Status: DC | PRN
  Administered 2016-12-31 – 2017-01-02 (×3): 4 mg via INTRAVENOUS
  Filled 2016-12-31 (×3): qty 2

## 2016-12-31 MED ORDER — LEVOTHYROXINE SODIUM 88 MCG PO TABS
88.0000 ug | ORAL_TABLET | Freq: Every day | ORAL | Status: DC
  Administered 2017-01-01 – 2017-01-02 (×2): 88 ug via ORAL
  Filled 2016-12-31 (×2): qty 1

## 2016-12-31 MED ORDER — BISACODYL 10 MG RE SUPP: 10.0000 mg | Freq: Every day | RECTAL | Status: DC | PRN

## 2016-12-31 MED ORDER — MIDAZOLAM HCL 5 MG/5ML IJ SOLN
INTRAMUSCULAR | Status: DC | PRN
  Administered 2016-12-31: 2 mg via INTRAVENOUS

## 2016-12-31 MED ORDER — FENTANYL CITRATE (PF) 100 MCG/2ML IJ SOLN
INTRAMUSCULAR | Status: DC | PRN
  Administered 2016-12-31: 50 ug via INTRAVENOUS

## 2016-12-31 MED ORDER — PROPOFOL 10 MG/ML IV BOLUS
INTRAVENOUS | Status: DC | PRN
  Administered 2016-12-31 (×2): 20 mg via INTRAVENOUS
  Administered 2016-12-31: 10 mg via INTRAVENOUS

## 2016-12-31 MED ORDER — METOCLOPRAMIDE HCL 5 MG/ML IJ SOLN
5.0000 mg | Freq: Three times a day (TID) | INTRAMUSCULAR | Status: DC | PRN
  Administered 2017-01-01: 10 mg via INTRAVENOUS
  Filled 2016-12-31: qty 2

## 2016-12-31 MED ORDER — METOCLOPRAMIDE HCL 5 MG PO TABS: 5.0000 mg | ORAL_TABLET | Freq: Three times a day (TID) | ORAL | Status: DC | PRN

## 2016-12-31 MED ORDER — ASPIRIN 81 MG PO CHEW
81.0000 mg | CHEWABLE_TABLET | Freq: Two times a day (BID) | ORAL | Status: DC
  Administered 2016-12-31 – 2017-01-02 (×4): 81 mg via ORAL
  Filled 2016-12-31 (×4): qty 1

## 2016-12-31 SURGICAL SUPPLY — 38 items
ADH SKN CLS APL DERMABOND .7 (GAUZE/BANDAGES/DRESSINGS) ×1
BAG DECANTER FOR FLEXI CONT (MISCELLANEOUS) IMPLANT
BAG SPEC THK2 15X12 ZIP CLS (MISCELLANEOUS)
BAG ZIPLOCK 12X15 (MISCELLANEOUS) IMPLANT
BLADE SAG 18X100X1.27 (BLADE) ×3 IMPLANT
CAPT HIP TOTAL 2 ×2 IMPLANT
CLOTH BEACON ORANGE TIMEOUT ST (SAFETY) ×3 IMPLANT
COVER PERINEAL POST (MISCELLANEOUS) ×3 IMPLANT
DERMABOND ADVANCED (GAUZE/BANDAGES/DRESSINGS) ×2
DERMABOND ADVANCED .7 DNX12 (GAUZE/BANDAGES/DRESSINGS) ×1 IMPLANT
DRAPE STERI IOBAN 125X83 (DRAPES) ×3 IMPLANT
DRAPE U-SHAPE 47X51 STRL (DRAPES) ×6 IMPLANT
DRESSING AQUACEL AG SP 3.5X10 (GAUZE/BANDAGES/DRESSINGS) ×1 IMPLANT
DRSG AQUACEL AG ADV 3.5X10 (GAUZE/BANDAGES/DRESSINGS) ×2 IMPLANT
DRSG AQUACEL AG SP 3.5X10 (GAUZE/BANDAGES/DRESSINGS) ×3
DURAPREP 26ML APPLICATOR (WOUND CARE) ×3 IMPLANT
ELECT REM PT RETURN 15FT ADLT (MISCELLANEOUS) IMPLANT
ELECT REM PT RETURN 9FT ADLT (ELECTROSURGICAL) ×3
ELECTRODE REM PT RTRN 9FT ADLT (ELECTROSURGICAL) ×1 IMPLANT
GLOVE BIOGEL M STRL SZ7.5 (GLOVE) ×4 IMPLANT
GLOVE BIOGEL PI IND STRL 7.5 (GLOVE) ×1 IMPLANT
GLOVE BIOGEL PI IND STRL 8.5 (GLOVE) ×1 IMPLANT
GLOVE BIOGEL PI INDICATOR 7.5 (GLOVE) ×10
GLOVE BIOGEL PI INDICATOR 8.5 (GLOVE) ×2
GLOVE ECLIPSE 8.0 STRL XLNG CF (GLOVE) ×2 IMPLANT
GLOVE ORTHO TXT STRL SZ7.5 (GLOVE) ×3 IMPLANT
GOWN STRL REUS W/TWL LRG LVL3 (GOWN DISPOSABLE) ×7 IMPLANT
GOWN STRL REUS W/TWL XL LVL3 (GOWN DISPOSABLE) ×3 IMPLANT
HOLDER FOLEY CATH W/STRAP (MISCELLANEOUS) ×3 IMPLANT
PACK ANTERIOR HIP CUSTOM (KITS) ×3 IMPLANT
SUT MNCRL AB 4-0 PS2 18 (SUTURE) ×3 IMPLANT
SUT VIC AB 1 CT1 36 (SUTURE) ×9 IMPLANT
SUT VIC AB 2-0 CT1 27 (SUTURE) ×6
SUT VIC AB 2-0 CT1 TAPERPNT 27 (SUTURE) ×2 IMPLANT
SUT VLOC 180 0 24IN GS25 (SUTURE) ×3 IMPLANT
TRAY FOLEY CATH 14FR (SET/KITS/TRAYS/PACK) ×2 IMPLANT
WATER STERILE IRR 1500ML POUR (IV SOLUTION) ×3 IMPLANT
YANKAUER SUCT BULB TIP 10FT TU (MISCELLANEOUS) IMPLANT

## 2016-12-31 NOTE — Op Note (Signed)
NAME:  Denise Garrett                ACCOUNT NO.: [id number]      MEDICAL RECORD NO.: [id number]      FACILITY:  [facility]      PHYSICIAN:  Paralee Cancel D  DATE OF BIRTH:  [birth year]     DATE OF PROCEDURE:  12/31/2016                                 OPERATIVE REPORT         PREOPERATIVE DIAGNOSIS: Left  hip osteoarthritis.      POSTOPERATIVE DIAGNOSIS:  Left hip osteoarthritis.      PROCEDURE:  Left total hip replacement through an anterior approach   utilizing DePuy THR system, component size 108mm pinnacle cup, a size 32+4 neutral   Altrex liner, a size 4 Hi Tri Lock stem with a 32+1 delta ceramic   ball.      SURGEON:  Pietro Cassis. Alvan Dame, M.D.      ASSISTANT:  Nehemiah Massed, PA-C     ANESTHESIA:  Spinal.      SPECIMENS:  None.      COMPLICATIONS:  None.      BLOOD LOSS:  500 cc     DRAINS:  None.      INDICATION OF THE PROCEDURE:  Denise Garrett is a 66 y.o. female who had   presented to office for evaluation of left hip pain.  Radiographs revealed   progressive degenerative changes with bone-on-bone   articulation to the  hip joint.  The patient had painful limited range of   motion significantly affecting their overall quality of life.  The patient was failing to    respond to conservative measures, and at this point was ready   to proceed with more definitive measures.  The patient has noted progressive   degenerative changes in his hip, progressive problems and dysfunction   with regarding the hip prior to surgery.  Consent was obtained for   benefit of pain relief.  Specific risk of infection, DVT, component   failure, dislocation, need for revision surgery, as well discussion of   the anterior versus posterior approach were reviewed.  Consent was   obtained for benefit of anterior pain relief through an anterior   approach.      PROCEDURE IN DETAIL:  The patient was brought to operative theater.   Once adequate anesthesia, preoperative  antibiotics, 2gm of Ancef, 1 gm of Tranexamic Acid, and 10 mg of Decadron administered.   The patient was positioned supine on the OSI Hanna table.  Once adequate   padding of boney process was carried out, we had predraped out the hip, and  used fluoroscopy to confirm orientation of the pelvis and position.      The left hip was then prepped and draped from proximal iliac crest to   mid thigh with shower curtain technique.      Time-out was performed identifying the patient, planned procedure, and   extremity.     An incision was then made 2 cm distal and lateral to the   anterior superior iliac spine extending over the orientation of the   tensor fascia lata muscle and sharp dissection was carried down to the   fascia of the muscle and protractor placed in the soft tissues.      The fascia was  then incised.  The muscle belly was identified and swept   laterally and retractor placed along the superior neck.  Following   cauterization of the circumflex vessels and removing some pericapsular   fat, a second cobra retractor was placed on the inferior neck.  A third   retractor was placed on the anterior acetabulum after elevating the   anterior rectus.  A L-capsulotomy was along the line of the   superior neck to the trochanteric fossa, then extended proximally and   distally.  Tag sutures were placed and the retractors were then placed   intracapsular.  We then identified the trochanteric fossa and   orientation of my neck cut, confirmed this radiographically   and then made a neck osteotomy with the femur on traction.  The femoral   head was removed without difficulty or complication.  Traction was let   off and retractors were placed posterior and anterior around the   acetabulum.      The labrum and foveal tissue were debrided.  I began reaming with a 29mm   reamer and reamed up to 67mm reamer with good bony bed preparation and a 78mm   cup was chosen.  The final 55mm Pinnacle cup  was then impacted under fluoroscopy  to confirm the depth of penetration and orientation with respect to   abduction.  A screw was placed followed by the hole eliminator.  The final   32+4 neutral Altrex liner was impacted with good visualized rim fit.  The cup was positioned anatomically within the acetabular portion of the pelvis.      At this point, the femur was rolled at 80 degrees.  Further capsule was   released off the inferior aspect of the femoral neck.  I then   released the superior capsule proximally.  The hook was placed laterally   along the femur and elevated manually and held in position with the bed   hook.  The leg was then extended and adducted with the leg rolled to 100   degrees of external rotation.  Once the proximal femur was fully   exposed, I used a box osteotome to set orientation.  I then began   broaching with the starting chili pepper broach and passed this by hand and then broached up to 4.  With the 4 broach in place I chose a high offset neck matching the other hip and did several trial reductions.  The offset was appropriate, leg lengths   appeared to be equal best matched with the +1 head ball confirmed radiographically.   Given these findings, I went ahead and dislocated the hip, repositioned all   retractors and positioned the right hip in the extended and abducted position.  The final 4 Hi Tri Lock stem was   chosen and it was impacted down to the level of neck cut.  Based on this   and the trial reduction, a 32+1 delta ceramic ball was chosen and   impacted onto a clean and dry trunnion, and the hip was reduced.  The   hip had been irrigated throughout the case again at this point.  I did   reapproximate the superior capsular leaflet to the anterior leaflet   using #1 Vicryl.  The fascia of the   tensor fascia lata muscle was then reapproximated using #1 Vicryl and #0 V-lock sutures.  The   remaining wound was closed with 2-0 Vicryl and running 4-0  Monocryl.   The hip  was cleaned, dried, and dressed sterilely using Dermabond and   Aquacel dressing.  She was then brought   to recovery room in stable condition tolerating the procedure well.    Nehemiah Massed, PA-C was present for the entirety of the case involved from   preoperative positioning, perioperative retractor management, general   facilitation of the case, as well as primary wound closure as assistant.            Pietro Cassis Alvan Dame, M.D.        12/31/2016 10:01 AM

## 2016-12-31 NOTE — Evaluation (Signed)
Physical Therapy Evaluation Patient Details Name: Denise Garrett MRN: [id number] DOB: Apr 25, 1951 Today's Date: 12/31/2016   History of Present Illness  L DA THA   Clinical Impression  Pt is s/p L DA THA resulting in the deficits listed below (see PT Problem List). Pt will benefit from acute PT to increase their independence and safety with mobility to allow discharge home. Pt limited during today's session with feeling nauseated and systematic with standing, with BP once seated and LES elevated BP 98/53.      Follow Up Recommendations Home health PT (depending on progress , may not need PT f/u )    Equipment Recommendations  None recommended by PT    Recommendations for Other Services       Precautions / Restrictions Precautions Precautions: None Restrictions Weight Bearing Restrictions: No      Mobility  Bed Mobility Overal bed mobility: Needs Assistance Bed Mobility: Supine to Sit     Supine to sit: Min assist     General bed mobility comments: assist for LE and upper boday and cues for sequencing   Transfers Overall transfer level: Needs assistance Equipment used: Rolling walker (2 wheeled) Transfers: Sit to/from Stand Sit to Stand: Min assist         General transfer comment: cues for RW hand placement   Ambulation/Gait Ambulation/Gait assistance: Min assist Ambulation Distance (Feet): 5 Feet Assistive device: Rolling walker (2 wheeled) Gait Pattern/deviations: Step-to pattern     General Gait Details: limited due to pt felt nauseated and faint feeling. once  seated in chair and legs elevated BP was at 98/53  Stairs            Wheelchair Mobility    Modified Rankin (Stroke Patients Only)       Balance                                             Pertinent Vitals/Pain Pain Assessment: 0-10 Pain Score: 2  Pain Location: L hip Pain Descriptors / Indicators: Aching Pain Intervention(s): Monitored during session;Ice  applied    Home Living Family/patient expects to be discharged to:: Private residence Living Arrangements: Spouse/significant other Available Help at Discharge: Family Type of Home: House Home Access: Stairs to enter Entrance Stairs-Rails: Adult nurse of Steps: 1+1+4+1 Home Layout: Able to live on main level with bedroom/bathroom;Two level Home Equipment: Walker - 2 wheels;Bedside commode;Walker - 4 wheels      Prior Function Level of Independence: Independent               Hand Dominance   Dominant Hand: Right    Extremity/Trunk Assessment        Lower Extremity Assessment Lower Extremity Assessment: LLE deficits/detail LLE Deficits / Details: limited slightly with hip flexion and ABD needing assist due to post op pain today        Communication   Communication: No difficulties  Cognition Arousal/Alertness: Awake/alert Behavior During Therapy: WFL for tasks assessed/performed Overall Cognitive Status: Within Functional Limits for tasks assessed                      General Comments      Exercises Total Joint Exercises Ankle Circles/Pumps: AROM;Supine;Left;10 reps Quad Sets: Supine;Left;10 reps;AROM Heel Slides: AAROM;10 reps;Left;Supine Hip ABduction/ADduction: AAROM;Left;Supine;5 reps   Assessment/Plan    PT Assessment Patient needs continued PT services  PT Problem List Decreased strength;Decreased activity tolerance;Decreased mobility          PT Treatment Interventions DME instruction;Gait training;Stair training;Functional mobility training;Therapeutic exercise;Therapeutic activities;Patient/family education    PT Goals (Current goals can be found in the Care Plan section)  Acute Rehab PT Goals Patient Stated Goal: To be able to move around again without pain  PT Goal Formulation: With patient Time For Goal Achievement: 01/14/17 Potential to Achieve Goals: Good    Frequency 7X/week   Barriers to discharge         Co-evaluation               End of Session Equipment Utilized During Treatment: Gait belt Activity Tolerance: Patient tolerated treatment well Patient left: in chair;with call bell/phone within reach;with chair alarm set Nurse Communication: Mobility status         Time: [id number] PT Time Calculation (min) (ACUTE ONLY): 33 min   Charges:   PT Evaluation $PT Eval Low Complexity: 1 Procedure PT Treatments $Therapeutic Activity: 8-22 mins   PT G CodesClide Dales Jan 04, 2017, 9:04 PM Clide Dales, PT Pager: [phone] 2017/01/04

## 2016-12-31 NOTE — Progress Notes (Signed)
PT Note  Patient Details Name: Denise Garrett MRN: [id number] DOB: [birth year]   Eval completed. Pt tolerated well, but limited to just stand pivot to chair this evening due to nauseated and soft BPs. Full write up to follow.    Clide Dales 12/31/2016, 5:41 PM  Clide Dales, PT Pager: [phone] 12/31/2016

## 2016-12-31 NOTE — Progress Notes (Signed)
Portable AP Pelvis and Lateral Left Hip X-rays done. 

## 2016-12-31 NOTE — Anesthesia Preprocedure Evaluation (Signed)
Anesthesia Evaluation  Patient identified by MRN, date of birth, ID band Patient awake    Reviewed: Allergy & Precautions, NPO status , Patient's Chart, lab work & pertinent test results  Airway Mallampati: I  TM Distance: >3 FB Neck ROM: Full    Dental no notable dental hx.    Pulmonary neg pulmonary ROS,    Pulmonary exam normal        Cardiovascular negative cardio ROS Normal cardiovascular exam     Neuro/Psych negative neurological ROS  negative psych ROS   GI/Hepatic negative GI ROS, Neg liver ROS,   Endo/Other  Hypothyroidism   Renal/GU negative Renal ROS     Musculoskeletal   Abdominal Normal abdominal exam  (+)   Peds negative pediatric ROS (+)  Hematology   Anesthesia Other Findings   Reproductive/Obstetrics negative OB ROS                             Anesthesia Physical  Anesthesia Plan  ASA: II  Anesthesia Plan: Spinal   Post-op Pain Management:    Induction:   Airway Management Planned: Natural Airway  Additional Equipment:   Intra-op Plan:   Post-operative Plan:   Informed Consent: I have reviewed the patients History and Physical, chart, labs and discussed the procedure including the risks, benefits and alternatives for the proposed anesthesia with the patient or authorized representative who has indicated his/her understanding and acceptance.     Plan Discussed with: CRNA and Surgeon  Anesthesia Plan Comments:         Anesthesia Quick Evaluation

## 2016-12-31 NOTE — Anesthesia Procedure Notes (Addendum)
Spinal  Patient location during procedure: OR Start time: 12/31/2016 8:40 AM End time: 12/31/2016 8:44 AM Reason for block: at surgeon's request Staffing Resident/CRNA: Anne Fu Performed: resident/CRNA  Preanesthetic Checklist Completed: patient identified, site marked, surgical consent, pre-op evaluation, timeout performed, IV checked, risks and benefits discussed and monitors and equipment checked Spinal Block Patient position: sitting Prep: DuraPrep Patient monitoring: heart rate, continuous pulse ox and blood pressure Approach: right paramedian Location: L2-3 Injection technique: single-shot Needle Needle type: Pencan  Needle gauge: 24 G Needle length: 9 cm Assessment Sensory level: T6 Additional Notes Expiration date of kit checked and confirmed. Patient tolerated procedure well, without complications. X 1 attempt with noted clear CSF return. Loss of motor and sensory on exam post injection.

## 2016-12-31 NOTE — Discharge Instructions (Signed)

## 2016-12-31 NOTE — Interval H&P Note (Signed)
History and Physical Interval Note:  12/31/2016 7:12 AM  Denise Garrett  has presented today for surgery, with the diagnosis of Left hip osteoarthritis  The various methods of treatment have been discussed with the patient and family. After consideration of risks, benefits and other options for treatment, the patient has consented to  Procedure(s) with comments: LEFT TOTAL HIP ARTHROPLASTY ANTERIOR APPROACH (Left) - Requests 70 mins as a surgical intervention .  The patient's history has been reviewed, patient examined, no change in status, stable for surgery.  I have reviewed the patient's chart and labs.  Questions were answered to the patient's satisfaction.     Mauri Pole

## 2016-12-31 NOTE — Anesthesia Postprocedure Evaluation (Addendum)
Anesthesia Post Note  Patient: Denise Garrett  Procedure(s) Performed: Procedure(s) (LRB): LEFT TOTAL HIP ARTHROPLASTY ANTERIOR APPROACH (Left)  Patient location during evaluation: PACU Anesthesia Type: Spinal Level of consciousness: awake Pain management: pain level controlled Vital Signs Assessment: post-procedure vital signs reviewed and stable Respiratory status: spontaneous breathing Cardiovascular status: stable Postop Assessment: no headache, no backache, spinal receding, patient able to bend at knees and no signs of nausea or vomiting Anesthetic complications: no        Last Vitals:  Vitals:   12/31/16 1215 12/31/16 1216  BP: 114/68 114/68  Pulse: 63 64  Resp: 16 18  Temp:      Last Pain:  Vitals:   12/31/16 1200  TempSrc:   PainSc: 0-No pain   Pain Goal: Patients Stated Pain Goal: 4 (12/31/16 0651)               Gilford Lardizabal JR,JOHN Mateo Flow

## 2016-12-31 NOTE — Progress Notes (Signed)
X-ray results noted 

## 2016-12-31 NOTE — Transfer of Care (Signed)
Immediate Anesthesia Transfer of Care Note  Patient: Denise Garrett  Procedure(s) Performed: Procedure(s) with comments: LEFT TOTAL HIP ARTHROPLASTY ANTERIOR APPROACH (Left) - Requests 70 mins  Patient Location: PACU  Anesthesia Type:Spinal  Level of Consciousness:  sedated, patient cooperative and responds to stimulation  Airway & Oxygen Therapy:Patient Spontanous Breathing and Patient connected to face mask oxgen  Post-op Assessment:  Report given to PACU RN and Post -op Vital signs reviewed and stable  Post vital signs:  Reviewed and stable  Last Vitals:  Vitals:   12/31/16 0553  BP: 130/74  Pulse: 78  Resp: 16  Temp: [id number] C    Complications: No apparent anesthesia complications

## 2017-01-01 ENCOUNTER — Encounter (HOSPITAL_COMMUNITY): Payer: Self-pay | Admitting: Orthopedic Surgery

## 2017-01-01 LAB — BASIC METABOLIC PANEL
Anion gap: 6 (ref 5–15)
BUN: 20 mg/dL (ref 6–20)
CO2: 26 mmol/L (ref 22–32)
Calcium: 8.6 mg/dL — ABNORMAL LOW (ref 8.9–10.3)
Chloride: 103 mmol/L (ref 101–111)
Creatinine, Ser: 0.85 mg/dL (ref 0.44–1.00)
Glucose, Bld: 142 mg/dL — ABNORMAL HIGH (ref 65–99)
POTASSIUM: 4.4 mmol/L (ref 3.5–5.1)
SODIUM: 135 mmol/L (ref 135–145)

## 2017-01-01 LAB — CBC
HCT: 31.8 % — ABNORMAL LOW (ref 36.0–46.0)
Hemoglobin: 10.9 g/dL — ABNORMAL LOW (ref 12.0–15.0)
MCH: 29.5 pg (ref 26.0–34.0)
MCHC: 34.3 g/dL (ref 30.0–36.0)
MCV: 85.9 fL (ref 78.0–100.0)
PLATELETS: 198 10*3/uL (ref 150–400)
RBC: 3.7 MIL/uL — AB (ref 3.87–5.11)
RDW: 12.6 % (ref 11.5–15.5)
WBC: 10.1 10*3/uL (ref 4.0–10.5)

## 2017-01-01 MED ORDER — ONDANSETRON HCL 4 MG PO TABS: 4.0000 mg | ORAL_TABLET | Freq: Four times a day (QID) | ORAL | 0 refills | Status: DC | PRN

## 2017-01-01 NOTE — Progress Notes (Signed)
   01/01/17 1200  PT Visit Information  Last PT Received On 01/01/17  Assistance Needed +1  History of Present Illness L DA THA   Subjective Data  Patient Stated Goal To be able to move around again without pain   Precautions  Precautions None  Restrictions  Other Position/Activity Restrictions WBAT  Pain Assessment  Pain Assessment 0-10  Pain Score 3  Pain Location L hip  Pain Descriptors / Indicators Aching;Sore  Pain Intervention(s) Limited activity within patient's tolerance;Monitored during session;Ice applied;Premedicated before session  Cognition  Arousal/Alertness Awake/alert  Behavior During Therapy WFL for tasks assessed/performed  Overall Cognitive Status Within Functional Limits for tasks assessed  Bed Mobility  General bed mobility comments NT--in chair  Transfers  Overall transfer level Needs assistance  Equipment used Rolling walker (2 wheeled)  Transfers Sit to/from Stand  Sit to Stand Supervision  General transfer comment cues for RW hand placement   Ambulation/Gait  Ambulation/Gait assistance Supervision;Min guard  Ambulation Distance (Feet) 120 Feet  Assistive device Rolling walker (2 wheeled)  Gait Pattern/deviations Step-to pattern;Step-through pattern  General Gait Details cues for gait progression  Stairs Yes  Stairs assistance Min assist;Min guard  Stair Management One rail Right;One rail Left;Step to pattern;Sideways;With walker;No rails;Backwards  Number of Stairs 6  General stair comments up/down 1 steps with walker x2, up/down 3 steps with one rail--cues for technique and safety  Total Joint Exercises  Ankle Circles/Pumps AROM;Supine;Left;10 reps  Quad Sets Left;10 reps;AROM  Heel Slides AAROM;10 reps;Left  Hip ABduction/ADduction AAROM;Left;Supine;10 reps  PT - End of Session  Equipment Utilized During Treatment Gait belt  Activity Tolerance Patient tolerated treatment well  Patient left in chair;with call bell/phone within reach;with chair  alarm set;with family/visitor present  Nurse Communication Mobility status  PT - Assessment/Plan  PT Plan Current plan remains appropriate  PT Frequency (ACUTE ONLY) 7X/week  Follow Up Recommendations No PT follow up;Home health PT (per MD)  PT equipment None recommended by PT  PT Goal Progression  Progress towards PT goals Progressing toward goals  Acute Rehab PT Goals  PT Goal Formulation With patient  Time For Goal Achievement 01/14/17  Potential to Achieve Goals Good  PT Time Calculation  PT Start Time (ACUTE ONLY) 1141  PT Stop Time (ACUTE ONLY) 1211  PT Time Calculation (min) (ACUTE ONLY) 30 min  PT General Charges  $$ ACUTE PT VISIT 1 Procedure  PT Treatments  $Gait Training 23-37 mins

## 2017-01-01 NOTE — Progress Notes (Signed)
OT Cancellation Note  Patient Details Name: Denise Garrett MRN: [id number] DOB: 1951-05-08   Cancelled Treatment:    Reason Eval/Treat Not Completed: OT screened, no needs identified, will sign off.  Pt had other hip replaced in 11/16, and feels comfortable with adls/bathroom transfers.  Mishel Sans 01/01/2017, 8:46 AM  Lesle Chris, OTR/L [phone] 01/01/2017

## 2017-01-01 NOTE — Care Management Note (Addendum)
Case Management Note  Patient Details  Name: Denise Garrett MRN: [id number] Date of Birth: 11/15/1951  Subjective/Objective:                   LEFT TOTAL HIP ARTHROPLASTY ANTERIOR APPROACH (Left)  Action/Plan: Discharge planning  Expected Discharge Date:  01/01/17               Expected Discharge Plan:  [address]  In-House Referral:     Discharge planning Services  CM Consult  Post Acute Care Choice:  Home Health Choice offered to:  Patient  DME Arranged:  N/A DME Agency:  NA  HH Arranged:  PT [place] Agency:  [facility] ([facility]  Status of Service:  Completed, signed off  If discussed at [employer], dates discussed:    Additional Comments: CM received notification pt will NOT have HHPT. CM notified patient and [facility] rep, Tim. NO other CM needs were communicated.  CM met with pt in room to offer choice of home health agency. Pt chooses [facility] to render HHPT. CM has requested HHPT order and face to face. Referral given to [facility] rep, Tim. Pt states she has all DME needed at home. No other CM needs were communicated. [name], RN 01/01/2017, 12:25 PM

## 2017-01-01 NOTE — Progress Notes (Signed)
     Subjective: 1 Day Post-Op Procedure(s) (LRB): LEFT TOTAL HIP ARTHROPLASTY ANTERIOR APPROACH (Left)   Patient reports pain as mild, pain controlled well.  No events throughout then night.  Some nausea this morning, not medication related.  Ready to be discharged home if her nausea resolves / is controlled and she does well with PT.   Objective:   VITALS:   Vitals:   01/01/17 0252 01/01/17 0520  BP: (!) 104/54 (!) 105/57  Pulse: 62 66  Resp: 16 16  Temp: 97.5 F (36.4 C) 97.4 F (36.3 C)    Dorsiflexion/Plantar flexion intact Incision: dressing C/D/I No cellulitis present Compartment soft  LABS  Recent Labs  01/01/17 0421  HGB 10.9*  HCT 31.8*  WBC 10.1  PLT 198     Recent Labs  01/01/17 0421  NA 135  K 4.4  BUN 20  CREATININE 0.85  GLUCOSE 142*     Assessment/Plan: 1 Day Post-Op Procedure(s) (LRB): LEFT TOTAL HIP ARTHROPLASTY ANTERIOR APPROACH (Left) Foley cath d/c'ed Advance diet Up with therapy D/C IV fluids Discharge home Follow up in 2 weeks at [facility]. Follow up with OLIN,Gavan Nordby D in 2 weeks.  Contact information:  [facility] [address], [place] [phone]    Overweight (BMI 25-29.9) Estimated body mass index is 27.04 kg/m as calculated from the following:   Height as of this encounter: 5' 5.5" (1.664 m).   Weight as of this encounter: 74.8 kg (165 lb). Patient also counseled that weight may inhibit the healing process Patient counseled that losing weight will help with future health issues        [place] Pugh. Armonii Sieh   PAC  01/01/2017, 8:20 AM

## 2017-01-01 NOTE — Progress Notes (Signed)
Made PA aware of patients inability to urinate after 6 hours, discharge order cancelled.

## 2017-01-01 NOTE — Progress Notes (Signed)
Physical Therapy Treatment Patient Details Name: Denise Garrett MRN: [id number] DOB: Jun 10, 1951 Today's Date: Jan 12, 2017    History of Present Illness L DA THA     PT Comments    Progressing well, will see for second session for further gait and stair training  Follow Up Recommendations  Home health PT     Equipment Recommendations  None recommended by PT    Recommendations for Other Services       Precautions / Restrictions Precautions Precautions: None Restrictions Weight Bearing Restrictions: No Other Position/Activity Restrictions: WBAT    Mobility  Bed Mobility   Bed Mobility: Supine to Sit     Supine to sit: Min guard;Min assist     General bed mobility comments: light min assist with LLE  Transfers Overall transfer level: Needs assistance Equipment used: Rolling walker (2 wheeled) Transfers: Sit to/from Stand Sit to Stand: Supervision         General transfer comment: cues for RW hand placement   Ambulation/Gait Ambulation/Gait assistance: Supervision;Min guard Ambulation Distance (Feet): 110 Feet Assistive device: Rolling walker (2 wheeled) Gait Pattern/deviations: Step-to pattern;Step-through pattern     General Gait Details: cues for sequence, able to progress to step through, chair follow for sfaety d/t mild dizziness   Stairs            Wheelchair Mobility    Modified Rankin (Stroke Patients Only)       Balance                                    Cognition Arousal/Alertness: Awake/alert Behavior During Therapy: WFL for tasks assessed/performed Overall Cognitive Status: Within Functional Limits for tasks assessed                      Exercises Total Joint Exercises Ankle Circles/Pumps: AROM;Supine;Left;10 reps    General Comments        Pertinent Vitals/Pain Pain Assessment: 0-10 Pain Score: 4  Pain Location: L hip Pain Descriptors / Indicators: Aching Pain Intervention(s): Limited  activity within patient's tolerance;Monitored during session;Premedicated before session;Repositioned;Ice applied    Home Living                      Prior Function            PT Goals (current goals can now be found in the care plan section) Acute Rehab PT Goals Patient Stated Goal: To be able to move around again without pain  PT Goal Formulation: With patient Time For Goal Achievement: 01/14/17 Potential to Achieve Goals: Good Progress towards PT goals: Progressing toward goals    Frequency    7X/week      PT Plan Current plan remains appropriate    Co-evaluation             End of Session Equipment Utilized During Treatment: Gait belt Activity Tolerance: Patient tolerated treatment well Patient left: in chair;with call bell/phone within reach;with chair alarm set     Time: [id number] PT Time Calculation (min) (ACUTE ONLY): 14 min  Charges:  $Gait Training: 8-22 mins                    G Codes:      Faustina Gebert 01/12/17, 9:59 AM

## 2017-01-02 LAB — CBC
HEMATOCRIT: 28.9 % — AB (ref 36.0–46.0)
Hemoglobin: 10 g/dL — ABNORMAL LOW (ref 12.0–15.0)
MCH: 28.7 pg (ref 26.0–34.0)
MCHC: 34.6 g/dL (ref 30.0–36.0)
MCV: 83 fL (ref 78.0–100.0)
Platelets: 171 10*3/uL (ref 150–400)
RBC: 3.48 MIL/uL — AB (ref 3.87–5.11)
RDW: 12.3 % (ref 11.5–15.5)
WBC: 10 10*3/uL (ref 4.0–10.5)

## 2017-01-02 LAB — BASIC METABOLIC PANEL
ANION GAP: 4 — AB (ref 5–15)
BUN: 15 mg/dL (ref 6–20)
CO2: 24 mmol/L (ref 22–32)
Calcium: 8.2 mg/dL — ABNORMAL LOW (ref 8.9–10.3)
Chloride: 99 mmol/L — ABNORMAL LOW (ref 101–111)
Creatinine, Ser: 0.63 mg/dL (ref 0.44–1.00)
GFR calc Af Amer: >60 mL/min (ref >60)
GFR calc non Af Amer: >60 mL/min (ref >60)
GLUCOSE: 127 mg/dL — AB (ref 65–99)
POTASSIUM: 4.4 mmol/L (ref 3.5–5.1)
Sodium: 127 mmol/L — ABNORMAL LOW (ref 135–145)

## 2017-01-02 NOTE — Progress Notes (Signed)
Physical Therapy Treatment Patient Details Name: Denise Garrett MRN: [id number] DOB: 1951-06-19 Today's Date: 01/02/2017    History of Present Illness L DA THA     PT Comments    POD # 2 This is pt's second THR, very knowledgeable.  Assisted with amb in hallway, practiced stairs then performed TE's following HEP handout.  Instructed on proper tech, freq as well as use of ICE.   Follow Up Recommendations  No PT follow up (pt given HEP)     Equipment Recommendations  None recommended by PT    Recommendations for Other Services       Precautions / Restrictions Precautions Precautions: None Restrictions Weight Bearing Restrictions: No Other Position/Activity Restrictions: WBAT    Mobility  Bed Mobility               General bed mobility comments: OOB in recliner  Transfers Overall transfer level: Needs assistance Equipment used: Rolling walker (2 wheeled) Transfers: Sit to/from Stand Sit to Stand: Supervision         General transfer comment: increased time  Ambulation/Gait Ambulation/Gait assistance: Supervision Ambulation Distance (Feet): 147 Feet Assistive device: Rolling walker (2 wheeled) Gait Pattern/deviations: Step-through pattern;Decreased stride length Gait velocity: WFL   General Gait Details: tolerated an increased distance.  no c/o nausea   Stairs Stairs: Yes   Stair Management: Step to pattern;One rail Right;Forwards Number of Stairs: 4 General stair comments: <25% VC's on proper seq and safety  Wheelchair Mobility    Modified Rankin (Stroke Patients Only)       Balance                                    Cognition Arousal/Alertness: Awake/alert Behavior During Therapy: WFL for tasks assessed/performed Overall Cognitive Status: Within Functional Limits for tasks assessed                      Exercises      General Comments        Pertinent Vitals/Pain Pain Assessment: 0-10 Pain Score: 3   Pain Location: L hip Pain Descriptors / Indicators: Aching;Sore;Tender Pain Intervention(s): Monitored during session;Repositioned;Ice applied    Home Living                      Prior Function            PT Goals (current goals can now be found in the care plan section) Progress towards PT goals: Progressing toward goals    Frequency    7X/week      PT Plan Current plan remains appropriate    Co-evaluation             End of Session Equipment Utilized During Treatment: Gait belt Activity Tolerance: Patient tolerated treatment well Patient left: in chair;with call bell/phone within reach;with chair alarm set;with family/visitor present     Time: [id number] PT Time Calculation (min) (ACUTE ONLY): 25 min  Charges:  $Gait Training: 8-22 mins $Therapeutic Exercise: 8-22 mins                    G Codes:      Rica Koyanagi  PTA [facility]  Acute  Rehab Pager      [phone]

## 2017-01-02 NOTE — Progress Notes (Signed)
Patient ID: Denise Garrett, female   DOB: 1951/09/16, 66 y.o.   MRN: [id number] Subjective: 2 Days Post-Op Procedure(s) (LRB): LEFT TOTAL HIP ARTHROPLASTY ANTERIOR APPROACH (Left)    Patient reports pain as mild.  Doing much better after a tough day yesterday with regards to nausea and dehydration issues limiting ability for safe ambulation  Objective:   VITALS:   Vitals:   01/01/17 2237 01/02/17 0539  BP: (!) 128/59 126/61  Pulse: 72 73  Resp: 18 16  Temp: 97.8 F (36.6 C) 97.5 F (36.4 C)    Neurovascular intact Incision: dressing C/D/I, left hip No significant bruising  LABS  Recent Labs  01/01/17 0421 01/02/17 0423  HGB 10.9* 10.0*  HCT 31.8* 28.9*  WBC 10.1 10.0  PLT 198 171     Recent Labs  01/01/17 0421 01/02/17 0423  NA 135 127*  K 4.4 4.4  BUN 20 15  CREATININE 0.85 0.63  GLUCOSE 142* 127*    No results for input(s): LABPT, INR in the last 72 hours.   Assessment/Plan: 2 Days Post-Op Procedure(s) (LRB): LEFT TOTAL HIP ARTHROPLASTY ANTERIOR APPROACH (Left)   Up with therapy  Discharge to home to after therapy.  She is status post right total hip so she is well aware of recovery Rx on chart RTC in 2 weeks Significantly improved nausea and hydration

## 2017-01-02 NOTE — Telephone Encounter (Signed)
Duplicate message.  Sent in on 12/30/16.  Message sent to the pharmacy.

## 2017-01-08 NOTE — Discharge Summary (Signed)
Physician Discharge Summary   Patient ID: Denise Garrett MRN: [id number] DOB/AGE: [birth year] 66 y.o.  Admit date: 12/31/2016 Discharge date: 01/02/2017  Primary Diagnosis:  Left  hip osteoarthritis.      Admission Diagnoses:  Past Medical History:  Diagnosis Date  . Anemia    hx of   . Arthritis   . Hx of colonic polyps   . Hypothyroidism   . Trigger finger    left ring finger   Discharge Diagnoses:   Principal Problem:   S/P right THA, AA Active Problems:   Overweight (BMI 25.0-29.9)  Estimated body mass index is 27.04 kg/m as calculated from the following:   Height as of this encounter: 5' 5.5" (1.664 m).   Weight as of this encounter: 74.8 kg (165 lb).  Procedure(s) (LRB): LEFT TOTAL HIP ARTHROPLASTY ANTERIOR APPROACH (Left)   Consults: None  HPI:   Denise Garrett is a 66 y.o. female who had   presented to office for evaluation of left hip pain.  Radiographs revealed   progressive degenerative changes with bone-on-bone   articulation to the  hip joint.  The patient had painful limited range of   motion significantly affecting their overall quality of life.  The patient was failing to    respond to conservative measures, and at this point was ready   to proceed with more definitive measures.  The patient has noted progressive   degenerative changes in his hip, progressive problems and dysfunction   with regarding the hip prior to surgery.  Consent was obtained for   benefit of pain relief.  Specific risk of infection, DVT, component   failure, dislocation, need for revision surgery, as well discussion of   the anterior versus posterior approach were reviewed.  Consent was   obtained for benefit of anterior pain relief through an anterior   approach. Laboratory Data: Admission on 12/31/2016, Discharged on 01/02/2017  Component Date Value Ref Range Status  . WBC 01/01/2017 10.1  4.0 - 10.5 K/uL Final  . RBC 01/01/2017 3.70* 3.87 - 5.11 MIL/uL Final  .  Hemoglobin 01/01/2017 10.9* 12.0 - 15.0 g/dL Final  . HCT 01/01/2017 31.8* 36.0 - 46.0 % Final  . MCV 01/01/2017 85.9  78.0 - 100.0 fL Final  . MCH 01/01/2017 29.5  26.0 - 34.0 pg Final  . MCHC 01/01/2017 34.3  30.0 - 36.0 g/dL Final  . RDW 01/01/2017 12.6  11.5 - 15.5 % Final  . Platelets 01/01/2017 198  150 - 400 K/uL Final  . Sodium 01/01/2017 135  135 - 145 mmol/L Final  . Potassium 01/01/2017 4.4  3.5 - 5.1 mmol/L Final  . Chloride 01/01/2017 103  101 - 111 mmol/L Final  . CO2 01/01/2017 26  22 - 32 mmol/L Final  . Glucose, Bld 01/01/2017 142* 65 - 99 mg/dL Final  . BUN 01/01/2017 20  6 - 20 mg/dL Final  . Creatinine, Ser 01/01/2017 0.85  0.44 - 1.00 mg/dL Final  . Calcium 01/01/2017 8.6* 8.9 - 10.3 mg/dL Final  . GFR calc non Af Amer 01/01/2017 >60  >60 mL/min Final  . GFR calc Af Amer 01/01/2017 >60  >60 mL/min Final   Comment: (NOTE) The eGFR has been calculated using the CKD EPI equation. This calculation has not been validated in all clinical situations. eGFR's persistently <60 mL/min signify possible Chronic Kidney Disease.   . Anion gap 01/01/2017 6  5 - 15 Final  . WBC 01/02/2017 10.0  4.0 - 10.5 K/uL  Final  . RBC 01/02/2017 3.48* 3.87 - 5.11 MIL/uL Final  . Hemoglobin 01/02/2017 10.0* 12.0 - 15.0 g/dL Final  . HCT 01/02/2017 28.9* 36.0 - 46.0 % Final  . MCV 01/02/2017 83.0  78.0 - 100.0 fL Final  . MCH 01/02/2017 28.7  26.0 - 34.0 pg Final  . MCHC 01/02/2017 34.6  30.0 - 36.0 g/dL Final  . RDW 01/02/2017 12.3  11.5 - 15.5 % Final  . Platelets 01/02/2017 171  150 - 400 K/uL Final  . Sodium 01/02/2017 127* 135 - 145 mmol/L Final  . Potassium 01/02/2017 4.4  3.5 - 5.1 mmol/L Final  . Chloride 01/02/2017 99* 101 - 111 mmol/L Final  . CO2 01/02/2017 24  22 - 32 mmol/L Final  . Glucose, Bld 01/02/2017 127* 65 - 99 mg/dL Final  . BUN 01/02/2017 15  6 - 20 mg/dL Final  . Creatinine, Ser 01/02/2017 0.63  0.44 - 1.00 mg/dL Final  . Calcium 01/02/2017 8.2* 8.9 - 10.3 mg/dL  Final  . GFR calc non Af Amer 01/02/2017 >60  >60 mL/min Final  . GFR calc Af Amer 01/02/2017 >60  >60 mL/min Final   Comment: (NOTE) The eGFR has been calculated using the CKD EPI equation. This calculation has not been validated in all clinical situations. eGFR's persistently <60 mL/min signify possible Chronic Kidney Disease.   Georgiann Hahn gap 01/02/2017 4* 5 - 15 Final  Hospital Outpatient Visit on 12/27/2016  Component Date Value Ref Range Status  . ABO/RH(D) 12/27/2016 A POS   Final  . Antibody Screen 12/27/2016 NEG   Final  . Sample Expiration 12/27/2016 01/03/2017   Final  . Extend sample reason 12/27/2016 NO TRANSFUSIONS OR PREGNANCY IN THE PAST 3 MONTHS   Final  . MRSA, PCR 12/27/2016 NEGATIVE  NEGATIVE Final  . Staphylococcus aureus 12/27/2016 NEGATIVE  NEGATIVE Final   Comment:        The Xpert SA Assay (FDA approved for NASAL specimens in patients over 69 years of age), is one component of a comprehensive surveillance program.  Test performance has been validated by [facility] for patients greater than or equal to 34 year old. It is not intended to diagnose infection nor to guide or monitor treatment.   Appointment on 12/23/2016  Component Date Value Ref Range Status  . Sodium 12/23/2016 141  135 - 145 mEq/L Final  . Potassium 12/23/2016 4.6  3.5 - 5.1 mEq/L Final  . Chloride 12/23/2016 104  96 - 112 mEq/L Final  . CO2 12/23/2016 29  19 - 32 mEq/L Final  . Glucose, Bld 12/23/2016 91  70 - 99 mg/dL Final  . BUN 12/23/2016 21  6 - 23 mg/dL Final  . Creatinine, Ser 12/23/2016 0.99  0.40 - 1.20 mg/dL Final  . Calcium 12/23/2016 10.0  8.4 - 10.5 mg/dL Final  . GFR 12/23/2016 59.87* >60.00 mL/min Final  . WBC 12/23/2016 4.7  4.0 - 10.5 K/uL Final  . RBC 12/23/2016 5.11  3.87 - 5.11 Mil/uL Final  . Hemoglobin 12/23/2016 15.0  12.0 - 15.0 g/dL Final  . HCT 12/23/2016 44.2  36.0 - 46.0 % Final  . MCV 12/23/2016 86.6  78.0 - 100.0 fl Final  . MCHC 12/23/2016 34.0  30.0  - 36.0 g/dL Final  . RDW 12/23/2016 12.8  11.5 - 15.5 % Final  . Platelets 12/23/2016 258.0  150.0 - 400.0 K/uL Final  . Neutrophils Relative % 12/23/2016 59.1  43.0 - 77.0 % Final  . Lymphocytes Relative  12/23/2016 29.1  12.0 - 46.0 % Final  . Monocytes Relative 12/23/2016 7.8  3.0 - 12.0 % Final  . Eosinophils Relative 12/23/2016 3.3  0.0 - 5.0 % Final  . Basophils Relative 12/23/2016 0.7  0.0 - 3.0 % Final  . Neutro Abs 12/23/2016 2.8  1.4 - 7.7 K/uL Final  . Lymphs Abs 12/23/2016 1.4  0.7 - 4.0 K/uL Final  . Monocytes Absolute 12/23/2016 0.4  0.1 - 1.0 K/uL Final  . Eosinophils Absolute 12/23/2016 0.2  0.0 - 0.7 K/uL Final  . Basophils Absolute 12/23/2016 0.0  0.0 - 0.1 K/uL Final  . Cholesterol 12/23/2016 178  0 - 200 mg/dL Final  . Triglycerides 12/23/2016 72.0  0.0 - 149.0 mg/dL Final  . HDL 12/23/2016 38.80* >39.00 mg/dL Final  . VLDL 12/23/2016 14.4  0.0 - 40.0 mg/dL Final  . LDL Cholesterol 12/23/2016 125* 0 - 99 mg/dL Final  . Total CHOL/HDL Ratio 12/23/2016 5   Final  . NonHDL 12/23/2016 138.95   Final  . TSH 12/23/2016 0.65  0.35 - 4.50 uIU/mL Final  . Total Bilirubin 12/23/2016 0.6  0.2 - 1.2 mg/dL Final  . Bilirubin, Direct 12/23/2016 0.1  0.0 - 0.3 mg/dL Final  . Alkaline Phosphatase 12/23/2016 61  39 - 117 U/L Final  . AST 12/23/2016 10  0 - 37 U/L Final  . ALT 12/23/2016 12  0 - 35 U/L Final  . Total Protein 12/23/2016 6.8  6.0 - 8.3 g/dL Final  . Albumin 12/23/2016 4.2  3.5 - 5.2 g/dL Final     X-Rays:Dg C-arm 1-60 Min-no Report  Result Date: 12/31/2016 Fluoroscopy was utilized by the requesting physician.  No radiographic interpretation.   Dg Hip Port Unilat With Pelvis 1v Left  Result Date: 12/31/2016 CLINICAL DATA:  Post left arthroplasty EXAM: DG HIP (WITH OR WITHOUT PELVIS) 1V PORT LEFT COMPARISON:  None. FINDINGS: Bilateral hip replacements. No hardware complicating feature. Soft tissue gas noted over the left hip. IMPRESSION: Left hip replacement with  remote right hip replacement. No hardware complicating feature. Electronically Signed   By: Rolm Baptise M.D.   On: 12/31/2016 11:28    EKG: Orders placed or performed in visit on 09/25/10  . Converted CEMR EKG     Hospital Course: Patient was admitted to [facility] and taken to the OR and underwent the above state procedure without complications.  Patient tolerated the procedure well and was later transferred to the recovery room and then to the orthopaedic floor for postoperative care.  They were given PO and IV analgesics for pain control following their surgery.  They were given 24 hours of postoperative antibiotics of  Anti-infectives    Start     Dose/Rate Route Frequency Ordered Stop   12/31/16 1500  ceFAZolin (ANCEF) IVPB 2g/100 mL premix     2 g 200 mL/hr over 30 Minutes Intravenous Every 6 hours 12/31/16 1253 12/31/16 2150   12/31/16 0647  ceFAZolin (ANCEF) IVPB 2g/100 mL premix     2 g 200 mL/hr over 30 Minutes Intravenous On call to O.R. 12/31/16 1275 12/31/16 0859    PT and OT were ordered for total hip protocol.  The patient was allowed to be WBAT with therapy. Discharge planning was consulted to help with postop disposition and equipment needs.  Patient had a good night on the evening of surgery but a tough day that afternoon.  They started to get up OOB with therapy on day one.  Dressing was checked  and was clean and dry.  Patient was seen in rounds and was ready to go home.   Discharge to home to after therapy.  She is status post right total hip so she is well aware of recovery Rx on chart RTC in 2 weeks  Discharge Instructions    Call MD / Call 911    Complete by:  As directed    If you experience chest pain or shortness of breath, CALL 911 and be transported to the hospital emergency room.  If you develope a fever above 101 F, pus (white drainage) or increased drainage or redness at the wound, or calf pain, call your surgeon's office.   Change dressing     Complete by:  As directed    Maintain surgical dressing until follow up in the clinic. If the edges start to pull up, may reinforce with tape. If the dressing is no longer working, may remove and cover with gauze and tape, but must keep the area dry and clean.  Call with any questions or concerns.   Constipation Prevention    Complete by:  As directed    Drink plenty of fluids.  Prune juice may be helpful.  You may use a stool softener, such as Colace (over the counter) 100 mg twice a day.  Use MiraLax (over the counter) for constipation as needed.   Diet - low sodium heart healthy    Complete by:  As directed    Discharge instructions    Complete by:  As directed    Maintain surgical dressing until follow up in the clinic. If the edges start to pull up, may reinforce with tape. If the dressing is no longer working, may remove and cover with gauze and tape, but must keep the area dry and clean.  Follow [facility] in 2 [facility] at [facility]. Call with any questions or concerns.   Increase activity slowly as tolerated    Complete by:  As directed    Weight bearing as tolerated with assist device (walker, cane, etc) as directed, use it as long as suggested by your surgeon or therapist, typically at least 4-6 weeks.   TED hose    Complete by:  As directed    Use stockings (TED hose) for 2 weeks on both leg(s).  You may remove them at night for sleeping.     Allergies as of 01/02/2017      Reactions   Codeine Nausea And Vomiting   Dilaudid [hydromorphone Hcl] Other (See Comments)   Patient mother died from drug. Patient does not want to be given this!      Medication List    STOP taking these medications   DUEXIS 800-26.6 MG Tabs Generic drug:  Ibuprofen-Famotidine     TAKE these medications   acetaminophen 500 MG tablet Commonly known as:  TYLENOL Take 2 tablets (1,000 mg total) by mouth every 8 (eight) hours.   aspirin 81 MG chewable tablet Chew 1 tablet (81 mg total) by mouth 2  (two) times daily. Take for 4 weeks.   docusate sodium 100 MG capsule Commonly known as:  COLACE Take 1 capsule (100 mg total) by mouth 2 (two) times daily.   ferrous sulfate 325 (65 FE) MG tablet Commonly known as:  FERROUSUL Take 1 tablet (325 mg total) by mouth 3 (three) times daily with meals.   levothyroxine 88 MCG tablet Commonly known as:  SYNTHROID, LEVOTHROID Take 1 tablet (88 mcg total) by mouth daily.  Magnesium 300 MG Caps Take 300-600 mg by mouth daily. depends on constipation if takes 300-600 mg   methocarbamol 500 MG tablet Commonly known as:  ROBAXIN Take 1 tablet (500 mg total) by mouth every 6 (six) hours as needed for muscle spasms.   ondansetron 4 MG tablet Commonly known as:  ZOFRAN Take 1 tablet (4 mg total) by mouth every 6 (six) hours as needed for nausea.   polyethylene glycol packet Commonly known as:  MIRALAX / GLYCOLAX Take 17 g by mouth daily.   SYSTANE OP Apply 1 drop to eye daily.   traMADol 50 MG tablet Commonly known as:  ULTRAM Take 1-2 tablets (50-100 mg total) by mouth every 6 (six) hours as needed for moderate pain or severe pain.   zolpidem 5 MG tablet Commonly known as:  AMBIEN Take 0.5-1 tablets (2.5-5 mg total) by mouth at bedtime as needed for sleep.      Follow-up Information    Mauri Pole, MD. Schedule an appointment as soon as possible for a visit in 2 week(s).   Specialty:  Orthopedic Surgery Contact information: [address] [place] [address] [phone]           Signed: Arlee Muslim, PA-C Orthopaedic Surgery 01/08/2017, 9:11 PM

## 2017-03-19 ENCOUNTER — Other Ambulatory Visit: Payer: Self-pay | Admitting: Internal Medicine

## 2017-03-19 DIAGNOSIS — Z1231 Encounter for screening mammogram for malignant neoplasm of breast: Secondary | ICD-10-CM

## 2017-04-07 ENCOUNTER — Ambulatory Visit
Admission: RE | Admit: 2017-04-07 | Discharge: 2017-04-07 | Disposition: A | Discharge status: Home / Self Care | Payer: Medicare Other | Source: Ambulatory Visit | Attending: Internal Medicine | Admitting: Internal Medicine

## 2017-04-07 DIAGNOSIS — Z1231 Encounter for screening mammogram for malignant neoplasm of breast: Secondary | ICD-10-CM

## 2017-05-09 NOTE — Addendum Note (Signed)
Addendum  created 05/09/17 0940 by Lyn Hollingshead, MD   Sign clinical note

## 2017-08-22 ENCOUNTER — Encounter: Payer: Self-pay | Admitting: Internal Medicine

## 2017-12-29 ENCOUNTER — Other Ambulatory Visit: Payer: Self-pay | Admitting: Internal Medicine

## 2018-01-04 NOTE — Progress Notes (Signed)
Chief Complaint  Patient presents with  . Annual Exam    hip replacement in last- recovered well.     HPI: Denise Garrett 67 y.o. comes in today for Preventive Medicare exam/ wellness visit  And med management .Since last visit. Doing ok  After hip issues  Less stress    Thyroid   Takes Medication  Daily in am  Then coffee   Only ocass use of  ambien when travels   Would like refill?  New grand child  In [place] Maintenance  Topic Date Due  . HIV Screening  03/12/1966  . DEXA SCAN  03/11/2017  . PNA vac Low Risk Adult (1 of 2 - PCV13) 03/11/2017  . COLONOSCOPY  01/18/2018  . PAP SMEAR  11/06/2018  . MAMMOGRAM  04/08/2019  . TETANUS/TDAP  09/25/2020  . INFLUENZA VACCINE  Completed  . Hepatitis C Screening  Completed   Health Maintenance Review LIFESTYLE:  Exercise:   Active sp hr  Tobacco/ETS: no Alcohol:  3-4 per week  Sugar beverages: no Sleep: better    Un assisted .  Drug use: no HH:    2  2 pets   New grand baby this  Week.    Hearing:   Ok   Vision:  No limitations at present . Last eye check UTD glasses   Safety:  Has smoke detector and wears seat belts.  No firearms. No excess sun exposure. Sees dentist regularly.  Falls: n  Memory: Felt to be good  , no concern from her or her family.  Depression: No anhedonia unusual crying or depressive symptoms  Nutrition: Eats well balanced diet; adequate calcium and vitamin D. No swallowing chewing problems.  Injury: no major injuries in the last six months.  Other healthcare providers:  Reviewed today .  Social:  Lives with spouse married.   Preventive parameters: up-to-date  Reviewed   ADLS:   There are no problems or need for assistance  driving, feeding, obtaining food, dressing, toileting and bathing, managing money using phone. She is independent.   ROS:  Takes magnesium  For  Regularity and seewms to work well  GEN/ HEENT: No fever, significant weight changes sweats headaches vision  problems hearing changes, CV/ PULM; No chest pain shortness of breath cough, syncope,edema  change in exercise tolerance. GI /GU: No adominal pain, vomiting, change in bowel habits. No blood in the stool. No significant GU symptoms. SKIN/HEME: ,no acute skin rashes suspicious lesions or bleeding. No lymphadenopathy, nodules, masses.  NEURO/ PSYCH:  No neurologic signs such as weakness numbness. No depression anxiety. IMM/ Allergy: No unusual infections.  Allergy .   REST of 12 system review negative except as per HPI   Past Medical History:  Diagnosis Date  . Anemia    hx of   . Arthritis   . Hx of colonic polyps   . Hypothyroidism   . Trigger finger    left ring finger    Family History  Problem Relation Age of Onset  . Heart attack Father   . Coronary artery disease Father   . Coronary artery disease Brother        quad bypass age 79  . Colon cancer Neg Hx   . Esophageal cancer Neg Hx   . Prostate cancer Neg Hx   . Rectal cancer Neg Hx     Social History   Socioeconomic History  . Marital status: Married    Spouse name: None  .  Number of children: 1  . Years of education: None  . Highest education level: None  Social Needs  . Financial resource strain: None  . Food insecurity - worry: None  . Food insecurity - inability: None  . Transportation needs - medical: None  . Transportation needs - non-medical: None  Occupational History    Employer: [employer]  Tobacco Use  . Smoking status: Never Smoker  . Smokeless tobacco: Never Used  Substance and Sexual Activity  . Alcohol use: Yes    Comment: social drinker, every weekend  . Drug use: No  . Sexual activity: None  Other Topics Concern  . None  Social History Narrative   Occupation: administrative Asst. [employer]   Married   Regular exercise- not recently    etoh 1 per day or so    HH of 2     Pets cats and dog    No ets.   Considering  retire ing in a couple years.   [place] sleep interrupted  With late  night noise    Outpatient Encounter Medications as of 01/05/2018  Medication Sig  . levothyroxine (SYNTHROID, LEVOTHROID) 88 MCG tablet TAKE ONE TABLET BY MOUTH DAILY  . Magnesium 300 MG CAPS Take 300-600 mg by mouth daily. depends on constipation if takes 300-600 mg  . zolpidem (AMBIEN) 5 MG tablet Take 0.5-1 tablets (2.5-5 mg total) by mouth at bedtime as needed for sleep.  . [DISCONTINUED] zolpidem (AMBIEN) 5 MG tablet Take 0.5-1 tablets (2.5-5 mg total) by mouth at bedtime as needed for sleep.  Marland Kitchen acetaminophen (TYLENOL) 500 MG tablet Take 2 tablets (1,000 mg total) by mouth every 8 (eight) hours. (Patient not taking: Reported on 01/05/2018)  . docusate sodium (COLACE) 100 MG capsule Take 1 capsule (100 mg total) by mouth 2 (two) times daily. (Patient not taking: Reported on 01/05/2018)  . ferrous sulfate (FERROUSUL) 325 (65 FE) MG tablet Take 1 tablet (325 mg total) by mouth 3 (three) times daily with meals. (Patient not taking: Reported on 01/05/2018)  . methocarbamol (ROBAXIN) 500 MG tablet Take 1 tablet (500 mg total) by mouth every 6 (six) hours as needed for muscle spasms. (Patient not taking: Reported on 01/05/2018)  . ondansetron (ZOFRAN) 4 MG tablet Take 1 tablet (4 mg total) by mouth every 6 (six) hours as needed for nausea. (Patient not taking: Reported on 01/05/2018)  . Polyethyl Glycol-Propyl Glycol (SYSTANE OP) Apply 1 drop to eye daily.  . polyethylene glycol (MIRALAX / GLYCOLAX) packet Take 17 g by mouth daily. (Patient not taking: Reported on 01/05/2018)  . traMADol (ULTRAM) 50 MG tablet Take 1-2 tablets (50-100 mg total) by mouth every 6 (six) hours as needed for moderate pain or severe pain. (Patient not taking: Reported on 01/05/2018)   No facility-administered encounter medications on file as of 01/05/2018.     EXAM:  BP 128/76 (BP Location: Right Arm, Patient Position: Sitting, Cuff Size: Normal)   Pulse 69   Temp 97.7 F (36.5 C) (Oral)   Ht 5' 4.75" (1.645 m)   Wt 176 lb 14.4 oz  (80.2 kg)   BMI 29.67 kg/m   Body mass index is 29.67 kg/m.  Physical Exam: Vital signs reviewed ZOX:WRUE is a well-developed well-nourished alert cooperative   who appears stated age in no acute distress.  HEENT: normocephalic atraumatic , Eyes: PERRL EOM's full, conjunctiva clear, Nares: paten,t no deformity discharge or tenderness., Ears: no deformity EAC's clear TMs with normal landmarks. Mouth: clear OP, no  lesions, edema.  Moist mucous membranes. Dentition in adequate repair. NECK: supple without masses, thyromegaly or bruits. CHEST/PULM:  Clear to auscultation and percussion breath sounds equal no wheeze , rales or rhonchi. No chest wall deformities or tenderness. Breast: normal by inspection . No dimpling, discharge, masses, tenderness or discharge . CV: PMI is nondisplaced, S1 S2 no gallops, murmurs, rubs. Peripheral pulses are full without delay.No JVD .  ABDOMEN: Bowel sounds normal nontender  No guard or rebound, no hepato splenomegal no CVA tenderness.   Extremtities:  No clubbing cyanosis or edema, no acute joint swelling or redness no focal atrophy NEURO:  Oriented x3, cranial nerves 3-12 appear to be intact, no obvious focal weakness,gait within normal limits no abnormal reflexes or asymmetrical SKIN: No acute rashes normal turgor, color, no bruising or petechiae. PSYCH: Oriented, good eye contact, no obvious depression anxiety, cognition and judgment appear normal. LN: no cervical axillary inguinal adenopathy No noted deficits in memory, attention, and speech.   Lab Results  Component Value Date   WBC 10.0 01/02/2017   HGB 10.0 (L) 01/02/2017   HCT 28.9 (L) 01/02/2017   PLT 171 01/02/2017   GLUCOSE 127 (H) 01/02/2017   CHOL 178 12/23/2016   TRIG 72.0 12/23/2016   HDL 38.80 (L) 12/23/2016   LDLCALC 125 (H) 12/23/2016   ALT 12 12/23/2016   AST 10 12/23/2016   NA 127 (L) 01/02/2017   K 4.4 01/02/2017   CL 99 (L) 01/02/2017   CREATININE 0.63 01/02/2017   BUN 15  01/02/2017   CO2 24 01/02/2017   TSH 0.65 12/23/2016   INR 1.01 09/29/2015    ASSESSMENT AND PLAN:  Discussed the following assessment and plan:  Visit for preventive health examination - Plan: Basic metabolic panel, CBC with Differential/Platelet, Hepatic function panel, Lipid panel, TSH  Medication management - Plan: Basic metabolic panel, CBC with Differential/Platelet, Hepatic function panel, Lipid panel, TSH  Hypothyroidism, unspecified type - medical monitorung  - Plan: Basic metabolic panel, CBC with Differential/Platelet, Hepatic function panel, Lipid panel, TSH  Estrogen deficiency - order for dexa placed - Plan: DG Bone Density, Basic metabolic panel, CBC with Differential/Platelet, Hepatic function panel, Lipid panel, TSH  Anemia, unspecified type - post op repeat to ensure back to normal  - Plan: Basic metabolic panel, CBC with Differential/Platelet, Hepatic function panel, Lipid panel, TSH  Hyperlipidemia, unspecified hyperlipidemia type - Plan: Basic metabolic panel, CBC with Differential/Platelet, Hepatic function panel, Lipid panel, TSH  Need for pneumococcal vaccination - Plan: Pneumococcal conjugate vaccine 13-valent  Patient Care Team: Burnis Medin, MD as PCP - General Jola Baptist, DC (Chiropractic Medicine) Paralee Cancel, MD as Consulting Physician (Orthopedic Surgery)  Patient Instructions  Get shingrix   Newer shingles vaccine.  At your pharmacy .  Bone density at [place] office  [place]. Radiology  Get your mammogram when due .   If all ok then yearly  Check up   Health Maintenance, Female Adopting a healthy lifestyle and getting preventive care can go a long way to promote health and wellness. Talk with your health care provider about what schedule of regular examinations is right for you. This is a good chance for you to check in with your provider about disease prevention and staying healthy. In between checkups,  there are plenty of things you can do on your own. Experts have done a lot of research about which lifestyle changes and preventive measures are most likely to keep  you healthy. Ask your health care provider for more information. Weight and diet Eat a healthy diet  Be sure to include plenty of vegetables, fruits, low-fat dairy products, and lean protein.  Do not eat a lot of foods high in solid fats, added sugars, or salt.  Get regular exercise. This is one of the most important things you can do for your health. ? Most adults should exercise for at least 150 minutes each week. The exercise should increase your heart rate and make you sweat (moderate-intensity exercise). ? Most adults should also do strengthening exercises at least twice a week. This is in addition to the moderate-intensity exercise.  Maintain a healthy weight  Body mass index (BMI) is a measurement that can be used to identify possible weight problems. It estimates body fat based on height and weight. Your health care provider can help determine your BMI and help you achieve or maintain a healthy weight.  For females 15 years of age and older: ? A BMI below 18.5 is considered underweight. ? A BMI of 18.5 to 24.9 is normal. ? A BMI of 25 to 29.9 is considered overweight. ? A BMI of 30 and above is considered obese.  Watch levels of cholesterol and blood lipids  You should start having your blood tested for lipids and cholesterol at 66 years of age, then have this test every 5 years.  You may need to have your cholesterol levels checked more often if: ? Your lipid or cholesterol levels are high. ? You are older than 66 years of age. ? You are at high risk for heart disease.  Cancer screening Lung Cancer  Lung cancer screening is recommended for adults 29-64 years old who are at high risk for lung cancer because of a history of smoking.  A yearly low-dose CT scan of the lungs is recommended for people  who: ? Currently smoke. ? Have quit within the past 15 years. ? Have at least a 30-pack-year history of smoking. A pack year is smoking an average of one pack of cigarettes a day for 1 year.  Yearly screening should continue until it has been 15 years since you quit.  Yearly screening should stop if you develop a health problem that would prevent you from having lung cancer treatment.  Breast Cancer  Practice breast self-awareness. This means understanding how your breasts normally appear and feel.  It also means doing regular breast self-exams. Let your health care provider know about any changes, no matter how small.  If you are in your 20s or 30s, you should have a clinical breast exam (CBE) by a health care provider every 1-3 years as part of a regular health exam.  If you are 3 or older, have a CBE every year. Also consider having a breast X-ray (mammogram) every year.  If you have a family history of breast cancer, talk to your health care provider about genetic screening.  If you are at high risk for breast cancer, talk to your health care provider about having an MRI and a mammogram every year.  Breast cancer gene (BRCA) assessment is recommended for women who have family members with BRCA-related cancers. BRCA-related cancers include: ? Breast. ? Ovarian. ? Tubal. ? Peritoneal cancers.  Results of the assessment will determine the need for genetic counseling and BRCA1 and BRCA2 testing.  Cervical Cancer Your health care provider may recommend that you be screened regularly for cancer of the pelvic organs (ovaries, uterus, and vagina). This  screening involves a pelvic examination, including checking for microscopic changes to the surface of your cervix (Pap test). You may be encouraged to have this screening done every 3 years, beginning at age 73.  For women ages 67-65, health care providers may recommend pelvic exams and Pap testing every 3 years, or they may recommend  the Pap and pelvic exam, combined with testing for human papilloma virus (HPV), every 5 years. Some types of HPV increase your risk of cervical cancer. Testing for HPV may also be done on women of any age with unclear Pap test results.  Other health care providers may not recommend any screening for nonpregnant women who are considered low risk for pelvic cancer and who do not have symptoms. Ask your health care provider if a screening pelvic exam is right for you.  If you have had past treatment for cervical cancer or a condition that could lead to cancer, you need Pap tests and screening for cancer for at least 20 years after your treatment. If Pap tests have been discontinued, your risk factors (such as having a new sexual partner) need to be reassessed to determine if screening should resume. Some women have medical problems that increase the chance of getting cervical cancer. In these cases, your health care provider may recommend more frequent screening and Pap tests.  Colorectal Cancer  This type of cancer can be detected and often prevented.  Routine colorectal cancer screening usually begins at 66 years of age and continues through 66 years of age.  Your health care provider may recommend screening at an earlier age if you have risk factors for colon cancer.  Your health care provider may also recommend using home test kits to check for hidden blood in the stool.  A small camera at the end of a tube can be used to examine your colon directly (sigmoidoscopy or colonoscopy). This is done to check for the earliest forms of colorectal cancer.  Routine screening usually begins at age 87.  Direct examination of the colon should be repeated every 5-10 years through 66 years of age. However, you may need to be screened more often if early forms of precancerous polyps or small growths are found.  Skin Cancer  Check your skin from head to toe regularly.  Tell your health care provider about  any new moles or changes in moles, especially if there is a change in a mole's shape or color.  Also tell your health care provider if you have a mole that is larger than the size of a pencil eraser.  Always use sunscreen. Apply sunscreen liberally and repeatedly throughout the day.  Protect yourself by wearing long sleeves, pants, a wide-brimmed hat, and sunglasses whenever you are outside.  Heart disease, diabetes, and high blood pressure  High blood pressure causes heart disease and increases the risk of stroke. High blood pressure is more likely to develop in: ? People who have blood pressure in the high end of the normal range (130-139/85-89 mm Hg). ? People who are overweight or obese. ? People who are African American.  If you are 57-38 years of age, have your blood pressure checked every 3-5 years. If you are 88 years of age or older, have your blood pressure checked every year. You should have your blood pressure measured twice-once when you are at a hospital or clinic, and once when you are not at a hospital or clinic. Record the average of the two measurements. To check your blood  pressure when you are not at a hospital or clinic, you can use: ? An automated blood pressure machine at a pharmacy. ? A home blood pressure monitor.  If you are between 53 years and 41 years old, ask your health care provider if you should take aspirin to prevent strokes.  Have regular diabetes screenings. This involves taking a blood sample to check your fasting blood sugar level. ? If you are at a normal weight and have a low risk for diabetes, have this test once every three years after 66 years of age. ? If you are overweight and have a high risk for diabetes, consider being tested at a younger age or more often. Preventing infection Hepatitis B  If you have a higher risk for hepatitis B, you should be screened for this virus. You are considered at high risk for hepatitis B if: ? You were born in  a country where hepatitis B is common. Ask your health care provider which countries are considered high risk. ? Your parents were born in a high-risk country, and you have not been immunized against hepatitis B (hepatitis B vaccine). ? You have HIV or AIDS. ? You use needles to inject street drugs. ? You live with someone who has hepatitis B. ? You have had sex with someone who has hepatitis B. ? You get hemodialysis treatment. ? You take certain medicines for conditions, including cancer, organ transplantation, and autoimmune conditions.  Hepatitis C  Blood testing is recommended for: ? Everyone born from [birth year] through 1964. ? Anyone with known risk factors for hepatitis C.  Sexually transmitted infections (STIs)  You should be screened for sexually transmitted infections (STIs) including gonorrhea and chlamydia if: ? You are sexually active and are younger than 66 years of age. ? You are older than 66 years of age and your health care provider tells you that you are at risk for this type of infection. ? Your sexual activity has changed since you were last screened and you are at an increased risk for chlamydia or gonorrhea. Ask your health care provider if you are at risk.  If you do not have HIV, but are at risk, it may be recommended that you take a prescription medicine daily to prevent HIV infection. This is called pre-exposure prophylaxis (PrEP). You are considered at risk if: ? You are sexually active and do not regularly use condoms or know the HIV status of your partner(s). ? You take drugs by injection. ? You are sexually active with a partner who has HIV.  Talk with your health care provider about whether you are at high risk of being infected with HIV. If you choose to begin PrEP, you should first be tested for HIV. You should then be tested every 3 months for as long as you are taking PrEP. Pregnancy  If you are premenopausal and you may become pregnant, ask your health  care provider about preconception counseling.  If you may become pregnant, take 400 to 800 micrograms (mcg) of folic acid every day.  If you want to prevent pregnancy, talk to your health care provider about birth control (contraception). Osteoporosis and menopause  Osteoporosis is a disease in which the bones lose minerals and strength with aging. This can result in serious bone fractures. Your risk for osteoporosis can be identified using a bone density scan.  If you are 45 years of age or older, or if you are at risk for osteoporosis and fractures, ask your health  care provider if you should be screened.  Ask your health care provider whether you should take a calcium or vitamin D supplement to lower your risk for osteoporosis.  Menopause may have certain physical symptoms and risks.  Hormone replacement therapy may reduce some of these symptoms and risks. Talk to your health care provider about whether hormone replacement therapy is right for you. Follow these instructions at home:  Schedule regular health, dental, and eye exams.  Stay current with your immunizations.  Do not use any tobacco products including cigarettes, chewing tobacco, or electronic cigarettes.  If you are pregnant, do not drink alcohol.  If you are breastfeeding, limit how much and how often you drink alcohol.  Limit alcohol intake to no more than 1 drink per day for nonpregnant women. One drink equals 12 ounces of beer, 5 ounces of wine, or 1 ounces of hard liquor.  Do not use street drugs.  Do not share needles.  Ask your health care provider for help if you need support or information about quitting drugs.  Tell your health care provider if you often feel depressed.  Tell your health care provider if you have ever been abused or do not feel safe at home. This information is not intended to replace advice given to you by your health care provider. Make sure you discuss any questions you have with  your health care provider. Document Released: 06/03/2011 Document Revised: 04/25/2016 Document Reviewed: 08/22/2015 Elsevier Interactive Patient Education  2018 [place]. Tyree Fluharty M.D.

## 2018-01-05 ENCOUNTER — Encounter: Payer: Self-pay | Admitting: Internal Medicine

## 2018-01-05 ENCOUNTER — Ambulatory Visit (INDEPENDENT_AMBULATORY_CARE_PROVIDER_SITE_OTHER): Payer: Medicare Other | Admitting: Internal Medicine

## 2018-01-05 VITALS — BP 128/76 | HR 69 | Temp 97.7°F | Ht 64.75 in | Wt 176.9 lb

## 2018-01-05 DIAGNOSIS — Z0000 Encounter for general adult medical examination without abnormal findings: Secondary | ICD-10-CM

## 2018-01-05 DIAGNOSIS — E2839 Other primary ovarian failure: Secondary | ICD-10-CM | POA: Diagnosis not present

## 2018-01-05 DIAGNOSIS — E785 Hyperlipidemia, unspecified: Secondary | ICD-10-CM | POA: Diagnosis not present

## 2018-01-05 DIAGNOSIS — D649 Anemia, unspecified: Secondary | ICD-10-CM | POA: Diagnosis not present

## 2018-01-05 DIAGNOSIS — Z79899 Other long term (current) drug therapy: Secondary | ICD-10-CM

## 2018-01-05 DIAGNOSIS — Z23 Encounter for immunization: Secondary | ICD-10-CM

## 2018-01-05 DIAGNOSIS — E039 Hypothyroidism, unspecified: Secondary | ICD-10-CM

## 2018-01-05 LAB — HEPATIC FUNCTION PANEL
ALT: 16 U/L (ref 0–35)
AST: 13 U/L (ref 0–37)
Albumin: 4.2 g/dL (ref 3.5–5.2)
Alkaline Phosphatase: 64 U/L (ref 39–117)
BILIRUBIN DIRECT: 0.1 mg/dL (ref 0.0–0.3)
BILIRUBIN TOTAL: 0.8 mg/dL (ref 0.2–1.2)
Total Protein: 6.7 g/dL (ref 6.0–8.3)

## 2018-01-05 LAB — LIPID PANEL
CHOL/HDL RATIO: 4
Cholesterol: 175 mg/dL (ref 0–200)
HDL: 48.1 mg/dL (ref >39.00)
LDL Cholesterol: 111 mg/dL — ABNORMAL HIGH (ref 0–99)
NONHDL: 126.62
Triglycerides: 79 mg/dL (ref 0.0–149.0)
VLDL: 15.8 mg/dL (ref 0.0–40.0)

## 2018-01-05 LAB — CBC WITH DIFFERENTIAL/PLATELET
Basophils Absolute: 0.1 10*3/uL (ref 0.0–0.1)
Basophils Relative: 1.3 % (ref 0.0–3.0)
EOS ABS: 0.1 10*3/uL (ref 0.0–0.7)
EOS PCT: 1.6 % (ref 0.0–5.0)
HCT: 43.9 % (ref 36.0–46.0)
HEMOGLOBIN: 14.9 g/dL (ref 12.0–15.0)
LYMPHS PCT: 34.9 % (ref 12.0–46.0)
Lymphs Abs: 1.7 10*3/uL (ref 0.7–4.0)
MCHC: 33.9 g/dL (ref 30.0–36.0)
MCV: 88.5 fl (ref 78.0–100.0)
Monocytes Absolute: 0.3 10*3/uL (ref 0.1–1.0)
Monocytes Relative: 7.1 % (ref 3.0–12.0)
Neutro Abs: 2.6 10*3/uL (ref 1.4–7.7)
Neutrophils Relative %: 55.1 % (ref 43.0–77.0)
Platelets: 220 10*3/uL (ref 150.0–400.0)
RBC: 4.96 Mil/uL (ref 3.87–5.11)
RDW: 13.4 % (ref 11.5–15.5)
WBC: 4.8 10*3/uL (ref 4.0–10.5)

## 2018-01-05 LAB — BASIC METABOLIC PANEL
BUN: 20 mg/dL (ref 6–23)
CO2: 29 mEq/L (ref 19–32)
Calcium: 9.4 mg/dL (ref 8.4–10.5)
Chloride: 103 mEq/L (ref 96–112)
Creatinine, Ser: 0.94 mg/dL (ref 0.40–1.20)
GFR: 63.36 mL/min (ref >60.00)
Glucose, Bld: 84 mg/dL (ref 70–99)
POTASSIUM: 3.9 meq/L (ref 3.5–5.1)
Sodium: 139 mEq/L (ref 135–145)

## 2018-01-05 LAB — TSH: TSH: 0.65 u[IU]/mL (ref 0.35–4.50)

## 2018-01-05 MED ORDER — ZOLPIDEM TARTRATE 5 MG PO TABS: 2.5000 mg | ORAL_TABLET | Freq: Every evening | ORAL | 0 refills | Status: DC | PRN

## 2018-01-05 NOTE — Addendum Note (Signed)
Addended by: Virl Cagey on: 01/05/2018 01:45 PM   Modules accepted: Orders

## 2018-01-05 NOTE — Patient Instructions (Addendum)
Get shingrix   Newer shingles vaccine.  At your pharmacy .  Bone density at [place] office  [place]. Radiology  Get your mammogram when due .   If all ok then yearly  Check up   Health Maintenance, Female Adopting a healthy lifestyle and getting preventive care can go a long way to promote health and wellness. Talk with your health care provider about what schedule of regular examinations is right for you. This is a good chance for you to check in with your provider about disease prevention and staying healthy. In between checkups, there are plenty of things you can do on your own. Experts have done a lot of research about which lifestyle changes and preventive measures are most likely to keep you healthy. Ask your health care provider for more information. Weight and diet Eat a healthy diet  Be sure to include plenty of vegetables, fruits, low-fat dairy products, and lean protein.  Do not eat a lot of foods high in solid fats, added sugars, or salt.  Get regular exercise. This is one of the most important things you can do for your health. ? Most adults should exercise for at least 150 minutes each week. The exercise should increase your heart rate and make you sweat (moderate-intensity exercise). ? Most adults should also do strengthening exercises at least twice a week. This is in addition to the moderate-intensity exercise.  Maintain a healthy weight  Body mass index (BMI) is a measurement that can be used to identify possible weight problems. It estimates body fat based on height and weight. Your health care provider can help determine your BMI and help you achieve or maintain a healthy weight.  For females 45 years of age and older: ? A BMI below 18.5 is considered underweight. ? A BMI of 18.5 to 24.9 is normal. ? A BMI of 25 to 29.9 is considered overweight. ? A BMI of 30 and above is considered obese.  Watch levels of cholesterol and blood  lipids  You should start having your blood tested for lipids and cholesterol at 66 years of age, then have this test every 5 years.  You may need to have your cholesterol levels checked more often if: ? Your lipid or cholesterol levels are high. ? You are older than 66 years of age. ? You are at high risk for heart disease.  Cancer screening Lung Cancer  Lung cancer screening is recommended for adults 21-24 years old who are at high risk for lung cancer because of a history of smoking.  A yearly low-dose CT scan of the lungs is recommended for people who: ? Currently smoke. ? Have quit within the past 15 years. ? Have at least a 30-pack-year history of smoking. A pack year is smoking an average of one pack of cigarettes a day for 1 year.  Yearly screening should continue until it has been 15 years since you quit.  Yearly screening should stop if you develop a health problem that would prevent you from having lung cancer treatment.  Breast Cancer  Practice breast self-awareness. This means understanding how your breasts normally appear and feel.  It also means doing regular breast self-exams. Let your health care provider know about any changes, no matter how small.  If you are in your 20s or 30s, you should have a clinical breast exam (CBE) by a health care provider every 1-3 years as part of a regular health  exam.  If you are 40 or older, have a CBE every year. Also consider having a breast X-ray (mammogram) every year.  If you have a family history of breast cancer, talk to your health care provider about genetic screening.  If you are at high risk for breast cancer, talk to your health care provider about having an MRI and a mammogram every year.  Breast cancer gene (BRCA) assessment is recommended for women who have family members with BRCA-related cancers. BRCA-related cancers include: ? Breast. ? Ovarian. ? Tubal. ? Peritoneal cancers.  Results of the assessment will  determine the need for genetic counseling and BRCA1 and BRCA2 testing.  Cervical Cancer Your health care provider may recommend that you be screened regularly for cancer of the pelvic organs (ovaries, uterus, and vagina). This screening involves a pelvic examination, including checking for microscopic changes to the surface of your cervix (Pap test). You may be encouraged to have this screening done every 3 years, beginning at age 42.  For women ages 39-65, health care providers may recommend pelvic exams and Pap testing every 3 years, or they may recommend the Pap and pelvic exam, combined with testing for human papilloma virus (HPV), every 5 years. Some types of HPV increase your risk of cervical cancer. Testing for HPV may also be done on women of any age with unclear Pap test results.  Other health care providers may not recommend any screening for nonpregnant women who are considered low risk for pelvic cancer and who do not have symptoms. Ask your health care provider if a screening pelvic exam is right for you.  If you have had past treatment for cervical cancer or a condition that could lead to cancer, you need Pap tests and screening for cancer for at least 20 years after your treatment. If Pap tests have been discontinued, your risk factors (such as having a new sexual partner) need to be reassessed to determine if screening should resume. Some women have medical problems that increase the chance of getting cervical cancer. In these cases, your health care provider may recommend more frequent screening and Pap tests.  Colorectal Cancer  This type of cancer can be detected and often prevented.  Routine colorectal cancer screening usually begins at 66 years of age and continues through 66 years of age.  Your health care provider may recommend screening at an earlier age if you have risk factors for colon cancer.  Your health care provider may also recommend using home test kits to check  for hidden blood in the stool.  A small camera at the end of a tube can be used to examine your colon directly (sigmoidoscopy or colonoscopy). This is done to check for the earliest forms of colorectal cancer.  Routine screening usually begins at age 73.  Direct examination of the colon should be repeated every 5-10 years through 66 years of age. However, you may need to be screened more often if early forms of precancerous polyps or small growths are found.  Skin Cancer  Check your skin from head to toe regularly.  Tell your health care provider about any new moles or changes in moles, especially if there is a change in a mole's shape or color.  Also tell your health care provider if you have a mole that is larger than the size of a pencil eraser.  Always use sunscreen. Apply sunscreen liberally and repeatedly throughout the day.  Protect yourself by wearing long sleeves, pants, a wide-brimmed  hat, and sunglasses whenever you are outside.  Heart disease, diabetes, and high blood pressure  High blood pressure causes heart disease and increases the risk of stroke. High blood pressure is more likely to develop in: ? People who have blood pressure in the high end of the normal range (130-139/85-89 mm Hg). ? People who are overweight or obese. ? People who are African American.  If you are 5-64 years of age, have your blood pressure checked every 3-5 years. If you are 59 years of age or older, have your blood pressure checked every year. You should have your blood pressure measured twice-once when you are at a hospital or clinic, and once when you are not at a hospital or clinic. Record the average of the two measurements. To check your blood pressure when you are not at a hospital or clinic, you can use: ? An automated blood pressure machine at a pharmacy. ? A home blood pressure monitor.  If you are between 42 years and 58 years old, ask your health care provider if you should take  aspirin to prevent strokes.  Have regular diabetes screenings. This involves taking a blood sample to check your fasting blood sugar level. ? If you are at a normal weight and have a low risk for diabetes, have this test once every three years after 66 years of age. ? If you are overweight and have a high risk for diabetes, consider being tested at a younger age or more often. Preventing infection Hepatitis B  If you have a higher risk for hepatitis B, you should be screened for this virus. You are considered at high risk for hepatitis B if: ? You were born in a country where hepatitis B is common. Ask your health care provider which countries are considered high risk. ? Your parents were born in a high-risk country, and you have not been immunized against hepatitis B (hepatitis B vaccine). ? You have HIV or AIDS. ? You use needles to inject street drugs. ? You live with someone who has hepatitis B. ? You have had sex with someone who has hepatitis B. ? You get hemodialysis treatment. ? You take certain medicines for conditions, including cancer, organ transplantation, and autoimmune conditions.  Hepatitis C  Blood testing is recommended for: ? Everyone born from [birth year] through 1964. ? Anyone with known risk factors for hepatitis C.  Sexually transmitted infections (STIs)  You should be screened for sexually transmitted infections (STIs) including gonorrhea and chlamydia if: ? You are sexually active and are younger than 66 years of age. ? You are older than 66 years of age and your health care provider tells you that you are at risk for this type of infection. ? Your sexual activity has changed since you were last screened and you are at an increased risk for chlamydia or gonorrhea. Ask your health care provider if you are at risk.  If you do not have HIV, but are at risk, it may be recommended that you take a prescription medicine daily to prevent HIV infection. This is called  pre-exposure prophylaxis (PrEP). You are considered at risk if: ? You are sexually active and do not regularly use condoms or know the HIV status of your partner(s). ? You take drugs by injection. ? You are sexually active with a partner who has HIV.  Talk with your health care provider about whether you are at high risk of being infected with HIV. If you choose to begin  PrEP, you should first be tested for HIV. You should then be tested every 3 months for as long as you are taking PrEP. Pregnancy  If you are premenopausal and you may become pregnant, ask your health care provider about preconception counseling.  If you may become pregnant, take 400 to 800 micrograms (mcg) of folic acid every day.  If you want to prevent pregnancy, talk to your health care provider about birth control (contraception). Osteoporosis and menopause  Osteoporosis is a disease in which the bones lose minerals and strength with aging. This can result in serious bone fractures. Your risk for osteoporosis can be identified using a bone density scan.  If you are 8 years of age or older, or if you are at risk for osteoporosis and fractures, ask your health care provider if you should be screened.  Ask your health care provider whether you should take a calcium or vitamin D supplement to lower your risk for osteoporosis.  Menopause may have certain physical symptoms and risks.  Hormone replacement therapy may reduce some of these symptoms and risks. Talk to your health care provider about whether hormone replacement therapy is right for you. Follow these instructions at home:  Schedule regular health, dental, and eye exams.  Stay current with your immunizations.  Do not use any tobacco products including cigarettes, chewing tobacco, or electronic cigarettes.  If you are pregnant, do not drink alcohol.  If you are breastfeeding, limit how much and how often you drink alcohol.  Limit alcohol intake to no more  than 1 drink per day for nonpregnant women. One drink equals 12 ounces of beer, 5 ounces of wine, or 1 ounces of hard liquor.  Do not use street drugs.  Do not share needles.  Ask your health care provider for help if you need support or information about quitting drugs.  Tell your health care provider if you often feel depressed.  Tell your health care provider if you have ever been abused or do not feel safe at home. This information is not intended to replace advice given to you by your health care provider. Make sure you discuss any questions you have with your health care provider. Document Released: 06/03/2011 Document Revised: 04/25/2016 Document Reviewed: 08/22/2015 Elsevier Interactive Patient Education  [employer].

## 2018-02-07 ENCOUNTER — Encounter: Payer: Self-pay | Admitting: Internal Medicine

## 2018-03-08 ENCOUNTER — Emergency Department (HOSPITAL_COMMUNITY): Payer: Medicare Other

## 2018-03-08 ENCOUNTER — Encounter (HOSPITAL_COMMUNITY): Payer: Self-pay

## 2018-03-08 ENCOUNTER — Emergency Department (HOSPITAL_COMMUNITY)
Admission: EM | Admit: 2018-03-08 | Discharge: 2018-03-08 | Disposition: A | Discharge status: Home / Self Care | Payer: Medicare Other | Attending: Emergency Medicine | Admitting: Emergency Medicine

## 2018-03-08 ENCOUNTER — Other Ambulatory Visit: Payer: Self-pay

## 2018-03-08 DIAGNOSIS — M25552 Pain in left hip: Secondary | ICD-10-CM | POA: Diagnosis not present

## 2018-03-08 DIAGNOSIS — R1032 Left lower quadrant pain: Secondary | ICD-10-CM | POA: Insufficient documentation

## 2018-03-08 DIAGNOSIS — Y999 Unspecified external cause status: Secondary | ICD-10-CM | POA: Insufficient documentation

## 2018-03-08 DIAGNOSIS — W1830XA Fall on same level, unspecified, initial encounter: Secondary | ICD-10-CM | POA: Insufficient documentation

## 2018-03-08 DIAGNOSIS — Z79899 Other long term (current) drug therapy: Secondary | ICD-10-CM | POA: Insufficient documentation

## 2018-03-08 DIAGNOSIS — Y929 Unspecified place or not applicable: Secondary | ICD-10-CM | POA: Diagnosis not present

## 2018-03-08 DIAGNOSIS — Z96643 Presence of artificial hip joint, bilateral: Secondary | ICD-10-CM | POA: Insufficient documentation

## 2018-03-08 DIAGNOSIS — Y939 Activity, unspecified: Secondary | ICD-10-CM | POA: Diagnosis not present

## 2018-03-08 DIAGNOSIS — Z043 Encounter for examination and observation following other accident: Secondary | ICD-10-CM | POA: Diagnosis present

## 2018-03-08 MED ORDER — LIDOCAINE 5 % EX PTCH: 1.0000 | MEDICATED_PATCH | CUTANEOUS | 0 refills | Status: DC

## 2018-03-08 MED ORDER — LIDOCAINE 5 % EX PTCH
1.0000 | MEDICATED_PATCH | CUTANEOUS | Status: DC
  Administered 2018-03-08: 1 via TRANSDERMAL
  Filled 2018-03-08: qty 1

## 2018-03-08 MED ORDER — METHOCARBAMOL 500 MG PO TABS
1000.0000 mg | ORAL_TABLET | Freq: Once | ORAL | Status: AC
  Administered 2018-03-08: 1000 mg via ORAL
  Filled 2018-03-08: qty 2

## 2018-03-08 MED ORDER — DIAZEPAM 5 MG/ML IJ SOLN
2.5000 mg | INTRAMUSCULAR | Status: AC
  Administered 2018-03-08: 2.5 mg via INTRAVENOUS
  Filled 2018-03-08: qty 2

## 2018-03-08 MED ORDER — FENTANYL CITRATE (PF) 100 MCG/2ML IJ SOLN
50.0000 ug | Freq: Once | INTRAMUSCULAR | Status: AC
  Administered 2018-03-08: 50 ug via INTRAVENOUS
  Filled 2018-03-08: qty 2

## 2018-03-08 MED ORDER — METHOCARBAMOL 500 MG PO TABS: 500.0000 mg | ORAL_TABLET | Freq: Two times a day (BID) | ORAL | 0 refills | Status: DC

## 2018-03-08 MED ORDER — KETOROLAC TROMETHAMINE 30 MG/ML IJ SOLN
15.0000 mg | Freq: Once | INTRAMUSCULAR | Status: AC
  Administered 2018-03-08: 15 mg via INTRAVENOUS
  Filled 2018-03-08: qty 1

## 2018-03-08 MED ORDER — DICLOFENAC SODIUM ER 100 MG PO TB24: 100.0000 mg | ORAL_TABLET | Freq: Every day | ORAL | 0 refills | Status: DC

## 2018-03-08 NOTE — ED Triage Notes (Signed)
Fell last night and now left hip pain and had a snap in left hip that was replaced no other pain voiced.

## 2018-03-08 NOTE — ED Provider Notes (Signed)
[facility] DEPT Provider Note   CSN: [id number] Arrival date & time: 03/08/18  0022     History   Chief Complaint Chief Complaint  Patient presents with  . Fall    HPI Denise Garrett is a 67 y.o. female.  The history is provided by the patient.  Groin Pain  This is a new problem. The current episode started 6 to 12 hours ago. The problem occurs constantly. The problem has not changed since onset.Pertinent negatives include no chest pain, no abdominal pain, no headaches and no shortness of breath. Nothing aggravates the symptoms. Nothing relieves the symptoms. She has tried nothing for the symptoms. The treatment provided no relief.  Was pivoting   Past Medical History:  Diagnosis Date  . Anemia    hx of   . Arthritis   . Hx of colonic polyps   . Hypothyroidism   . Trigger finger    left ring finger    Patient Active Problem List   Diagnosis Date Noted  . Overweight (BMI 25.0-29.9) 10/11/2015  . S/P right THA, AA 10/10/2015  . Family history of heart disease 10/26/2014  . Groin swelling 12/03/2013  . Lt groin pain 12/03/2013  . Hair loss 11/09/2012  . Sleep disturbance 11/09/2012  . Constipation 07/04/2012  . Visit for preventive health examination 02/14/2012  . Routine gynecological examination 02/14/2012  . Hyperlipidemia 11/11/2007  . Hypothyroidism 11/03/2007  . ALLERGIC RHINITIS 11/03/2007  . ARTHRITIS 11/03/2007  . History of colonic polyps 11/03/2007    Past Surgical History:  Procedure Laterality Date  . bunion removed    . COLONOSCOPY    . EXPLORATORY LAPAROTOMY    . POLYPECTOMY    . TOTAL HIP ARTHROPLASTY Right 10/10/2015   Procedure: TOTAL RIGHT HIP ARTHROPLASTY ANTERIOR APPROACH;  Surgeon: Paralee Cancel, MD;  Location: [facility] ORS;  Service: Orthopedics;  Laterality: Right;  . TOTAL HIP ARTHROPLASTY Left 12/31/2016   Procedure: LEFT TOTAL HIP ARTHROPLASTY ANTERIOR APPROACH;  Surgeon: Paralee Cancel, MD;  Location: [facility] OR[facility];   Service: Orthopedics;  Laterality: Left;  Requests 70 mins  . WISDOM TOOTH EXTRACTION       OB History   None      Home Medications    Prior to Admission medications   Medication Sig Start Date End Date Taking? Authorizing Provider  levothyroxine (SYNTHROID, LEVOTHROID) 88 MCG tablet TAKE ONE TABLET BY MOUTH DAILY 12/29/17  Yes Panosh, Standley Brooking, MD  Magnesium 300 MG CAPS Take 300-600 mg by mouth daily. depends on constipation if takes 300-600 mg   Yes [provider]  predniSONE (DELTASONE) 5 MG tablet Take 5 mg by mouth See admin instructions. 12 day taper started 04/03 03/04/18  Yes [provider]  zolpidem (AMBIEN) 5 MG tablet Take 0.5-1 tablets (2.5-5 mg total) by mouth at bedtime as needed for sleep. 01/05/18  Yes Panosh, Standley Brooking, MD  Diclofenac Sodium CR (VOLTAREN-XR) 100 MG 24 hr tablet Take 1 tablet (100 mg total) by mouth daily. 03/08/18   Braidan Ricciardi, MD  lidocaine (LIDODERM) 5 % Place 1 patch onto the skin daily. Remove & Discard patch within 12 hours or as directed by MD 03/08/18   Randal Buba, Brodee Mauritz, MD  methocarbamol (ROBAXIN) 500 MG tablet Take 1 tablet (500 mg total) by mouth 2 (two) times daily. 03/08/18   Natascha Edmonds, MD  traMADol (ULTRAM) 50 MG tablet Take 1-2 tablets (50-100 mg total) by mouth every 6 (six) hours as needed for moderate pain or  severe pain. Patient not taking: Reported on 01/05/2018 12/31/16   Danae Orleans, PA-C    Family History Family History  Problem Relation Age of Onset  . Heart attack Father   . Coronary artery disease Father   . Coronary artery disease Brother        quad bypass age 70  . Colon cancer Neg Hx   . Esophageal cancer Neg Hx   . Prostate cancer Neg Hx   . Rectal cancer Neg Hx     Social History Social History   Tobacco Use  . Smoking status: Never Smoker  . Smokeless tobacco: Never Used  Substance Use Topics  . Alcohol use: Yes    Comment: social drinker, every weekend  . Drug use: No     Allergies     Codeine and Dilaudid [hydromorphone hcl]   Review of Systems Review of Systems  Constitutional: Negative for fever.  Respiratory: Negative for shortness of breath.   Cardiovascular: Negative for chest pain.  Gastrointestinal: Negative for abdominal pain.  Musculoskeletal: Positive for arthralgias and gait problem. Negative for joint swelling, myalgias, neck pain and neck stiffness.  Neurological: Negative for weakness and headaches.  All other systems reviewed and are negative.    Physical Exam Updated Vital Signs BP (!) 142/72 (BP Location: Right Arm)   Pulse 70   Temp 98.4 F (36.9 C) (Oral)   Resp 15   Ht 5\' 6"  (1.676 m)   Wt 78.5 kg (173 lb)   SpO2 100%   BMI 27.92 kg/m   Physical Exam  Constitutional: She is oriented to person, place, and time. She appears well-developed and well-nourished. No distress.  HENT:  Head: Normocephalic and atraumatic.  Nose: Nose normal.  Mouth/Throat: No oropharyngeal exudate.  Eyes: Pupils are equal, round, and reactive to light. Conjunctivae are normal.  Neck: Normal range of motion. Neck supple.  Cardiovascular: Normal rate, regular rhythm, normal heart sounds and intact distal pulses.  Pulmonary/Chest: Effort normal and breath sounds normal.  Abdominal: Soft. Bowel sounds are normal. She exhibits no mass. There is no tenderness. There is no rebound and no guarding.  Musculoskeletal: Normal range of motion. She exhibits no deformity.       Left hip: She exhibits normal range of motion, normal strength, no bony tenderness, no swelling, no crepitus, no deformity and no laceration.       Left knee: Normal. She exhibits normal range of motion, no swelling, no effusion, no ecchymosis, no deformity, no laceration, no erythema, normal alignment, no LCL laxity, normal patellar mobility, no bony tenderness, normal meniscus and no MCL laxity. No tenderness found. No medial joint line, no lateral joint line, no MCL, no LCL and no patellar tendon  tenderness noted.       Left ankle: Normal. Achilles tendon normal.       Left upper leg: Normal. She exhibits no tenderness, no bony tenderness, no swelling, no edema, no deformity and no laceration.  Quadriceps tendon intact.  No tenderness at pes anserine.  No soft tissue mass to suggest tear of muscle or tendon.    Neurological: She is alert and oriented to person, place, and time. She displays normal reflexes.  Skin: Skin is warm and dry. Capillary refill takes less than 2 seconds.  Psychiatric: She has a normal mood and affect.     ED Treatments / Results  Labs (all labs ordered are listed, but only abnormal results are displayed) Labs Reviewed - No data to display  EKG  None  Radiology Dg Chest 2 View  Result Date: 03/08/2018 CLINICAL DATA:  Pain after fall last evening. EXAM: CHEST - 2 VIEW COMPARISON:  None. FINDINGS: The heart size and mediastinal contours are within normal limits. No aortic aneurysm. Minimal aortic atherosclerosis at the arch. Both lungs are clear. Mild degenerative change along the included thoracolumbar spine. IMPRESSION: No active cardiopulmonary disease. Electronically Signed   By: Ashley Royalty M.D.   On: 03/08/2018 02:48   Dg Hip Unilat With Pelvis 2-3 Views Left  Result Date: 03/08/2018 CLINICAL DATA:  Left hip pain after fall last evening. EXAM: DG HIP (WITH OR WITHOUT PELVIS) 2-3V LEFT COMPARISON:  12/31/2016 FINDINGS: Intact bilateral uncemented total hip arthroplasties. No joint dislocations. There is no evidence of arthropathy or other focal bone abnormality. IMPRESSION: Negative for acute fracture or malalignment of either hip. 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 Intact pelvis. Electronically Signed   By: Ashley Royalty M.D.   On: 03/08/2018 02:44    Procedures Procedures (including critical care time)  Medications Ordered in ED Medications  lidocaine (LIDODERM) 5 % 1 patch (1 patch Transdermal Patch Applied 03/08/18 0404)    fentaNYL (SUBLIMAZE) injection 50 mcg (50 mcg Intravenous Given 03/08/18 0157)  ketorolac (TORADOL) 30 MG/ML injection 15 mg (15 mg Intravenous Given 03/08/18 0328)  methocarbamol (ROBAXIN) tablet 1,000 mg (1,000 mg Oral Given 03/08/18 0328)  diazepam (VALIUM) injection 2.5 mg (2.5 mg Intravenous Given 03/08/18 0354)     515 case d/w Dr. Delfino Lovett, knee immobilizer and follow up.  No emergent MRI.     Can stop steroids after 3 days or do short taper.  Patient prefers short taper Final Clinical Impressions(s) / ED Diagnoses   Final diagnoses:  Groin pain, left  I suspect groin pull or strain.  I do not think there is a complete tear of the muscle or tendon.  Case d/w orthopedics will place in immobilizer for stabilization and patient to follow up for repeat exam and advanced imaging as an outpatient.  Patient and husband understand and agree to follow up.   Ice, gentle stretching, knee immobilizer and close follow up with orthopedics.   Return for weakness, numbness, changes in vision or speech, fevers >100.4 unrelieved by medication, shortness of breath, intractable vomiting, or diarrhea, abdominal pain, Inability to tolerate liquids or food, cough, altered mental status or any concerns. No signs of systemic illness or infection. The patient is nontoxic-appearing on exam and vital signs are within normal limits.   I have reviewed the triage vital signs and the nursing notes. Pertinent labs &imaging results that were available during my care of the patient were reviewed by me and considered in my medical decision making (see chart for details).  After history, exam, and medical workup I feel the patient has been appropriately medically screened and is safe for discharge home. Pertinent diagnoses were discussed with the patient. Patient was given return precautions.  ED Discharge Orders        Ordered    Diclofenac Sodium CR (VOLTAREN-XR) 100 MG 24 hr tablet  Daily     03/08/18 0511     methocarbamol (ROBAXIN) 500 MG tablet  2 times daily     03/08/18 0511    lidocaine (LIDODERM) 5 %  Every 24 hours     03/08/18 0511       Marshon Bangs, MD 03/08/18 [phone]

## 2018-03-08 NOTE — ED Notes (Signed)
Patient given water

## 2018-03-08 NOTE — ED Notes (Signed)
Patient was dodging a ball at ball game. Patient felt something snap. Patient has hardware in both upper thigh from degenerative reasons. Patient has no bruising or bumps to site (left hip).

## 2018-04-02 ENCOUNTER — Other Ambulatory Visit: Payer: Self-pay | Admitting: Internal Medicine

## 2018-04-06 ENCOUNTER — Telehealth: Payer: Self-pay | Admitting: Internal Medicine

## 2018-04-06 NOTE — Telephone Encounter (Signed)
Copied from [place] [id number]. Topic: Quick Communication - Rx Refill/Question >> Apr 06, 2018 10:02 AM Oliver Pila B wrote: Pt called and states she is needing an antibiotic for dental work that she is having in one week , call pt to advise, pt states its usually amoxicillin that is given

## 2018-04-07 ENCOUNTER — Telehealth: Payer: Self-pay | Admitting: *Deleted

## 2018-04-07 ENCOUNTER — Ambulatory Visit: Payer: Medicare Other

## 2018-04-07 NOTE — Telephone Encounter (Signed)
Pt aware of recommendations per Dr Regis Bill - pt is going to call the Ortho office to request this. Nothing further needed.

## 2018-04-07 NOTE — Telephone Encounter (Signed)
I dont see an indication for  Prophylaxis   At this time  Such as  Heart valve replacement .   If her ortho thinks she needs it ( usually not this far out then  They should make that call and do the rx )

## 2018-04-07 NOTE — Telephone Encounter (Signed)
Discussed w/ Dr. Regis Bill. No need for MMR vaccine given DOB, but can give if patient still wants. Pt notified of results/instructions and verbalized understanding and chose not to have vaccine.

## 2018-04-07 NOTE — Telephone Encounter (Signed)
Please advise Dr Regis Bill, thanks.   Allergies  Allergen Reactions  . Codeine Nausea And Vomiting  . Dilaudid [Hydromorphone Hcl] Other (See Comments)    Patient mother died from drug. Patient does not want to be given this!

## 2018-04-07 NOTE — Telephone Encounter (Signed)
Patient scheduled nurse visit today at 9:30 for MMR vaccine. Please advise if this vaccine is indicated/okay. She was born in 1952, so considered immune per CDC guidelines.

## 2018-09-22 ENCOUNTER — Other Ambulatory Visit: Payer: Self-pay | Admitting: Internal Medicine

## 2018-09-23 LAB — HM MAMMOGRAPHY

## 2018-09-29 ENCOUNTER — Other Ambulatory Visit: Payer: Self-pay | Admitting: Internal Medicine

## 2018-11-13 ENCOUNTER — Encounter: Payer: Self-pay | Admitting: Internal Medicine

## 2019-01-08 NOTE — Progress Notes (Signed)
Chief Complaint  Patient presents with  . Annual Exam    pt is having bad seasonal allergies states that zyrtec does not help and has tried claritin and benadryl at night. pt is having itchy eyesthat she states is affecting her vision     HPI: Denise Garrett 68 y.o. comes in today for Preventive Medicare exam/ wellness visit .Since last visit.  Has done pretty well but she is battling itchy recurrent red eyes despite different drops given by her eye doctor.  She prefers to wear contacts but has been wearing glasses has changed her eye make-up everything hypoallergenic.  Gets occasional upper respiratory allergy worse when there is warm rain or going to the beach. Is pleased that her mobility is so much better after her hip replacement.   Health Maintenance  Topic Date Due  . DEXA SCAN  03/11/2017  . COLONOSCOPY  01/18/2018  . PNA vac Low Risk Adult (2 of 2 - PPSV23) 01/05/2019  . MAMMOGRAM  09/23/2020  . TETANUS/TDAP  09/25/2020  . INFLUENZA VACCINE  Completed  . Hepatitis C Screening  Completed   Health Maintenance Review LIFESTYLE:  Exercise:   Not a lot   Likes golf and signed up for pilates.  Tobacco/ETS:  no Alcohol:   7 -8 per week.  Sugar beverages: no Sleep:ave 6  Drug use: no HH: 2 2 pets    Hearing:  ok  Vision:  No limitations at present . Last eye check UTD ? Allergy related  Itching.   Safety:  Has smoke detector and wears seat belts.  No firearms. No excess sun exposure. Sees dentist regularly.  Falls: n  Advance directive :  Reviewed  Has one.  Memory: Felt to be good  , no concern from her or her family.  Depression: No anhedonia unusual crying or depressive symptoms  Nutrition: Eats well balanced diet; adequate calcium and vitamin D. No swallowing chewing problems.  Injury: no major injuries in the last six months.  Other healthcare providers:  Reviewed today .   Preventive parameters:   Reviewed   ADLS:   There are no problems or need for  assistance  driving, feeding, obtaining food, dressing, toileting and bathing, managing money using phone. She is independent.   ROS:  GEN/ HEENT: No fever, significant weight changes sweats headaches vision problems hearing changes, CV/ PULM; No chest pain shortness of breath cough, syncope,edema  change in exercise tolerance. GI /GU: No adominal pain, vomiting, change in bowel habits. No blood in the stool. No significant GU symptoms. SKIN/HEME: ,no acute skin rashes suspicious lesions or bleeding. No lymphadenopathy, nodules, masses.  NEURO/ PSYCH:  No neurologic signs such as weakness numbness. No depression anxiety. IMM/ Allergy: No unusual infections.  Allergy .   REST of 12 system review negative except as per HPI   Past Medical History:  Diagnosis Date  . Anemia    hx of   . Arthritis   . Hx of colonic polyps   . Hypothyroidism   . Trigger finger    left ring finger    Family History  Problem Relation Age of Onset  . Heart attack Father   . Coronary artery disease Father   . Coronary artery disease Brother        quad bypass age 79  . Colon cancer Neg Hx   . Esophageal cancer Neg Hx   . Prostate cancer Neg Hx   . Rectal cancer Neg Hx  Social History   Socioeconomic History  . Marital status: Married    Spouse name: Not on file  . Number of children: 1  . Years of education: Not on file  . Highest education level: Not on file  Occupational History    Employer: [employer]  Social Needs  . Financial resource strain: Not on file  . Food insecurity:    Worry: Not on file    Inability: Not on file  . Transportation needs:    Medical: Not on file    Non-medical: Not on file  Tobacco Use  . Smoking status: Never Smoker  . Smokeless tobacco: Never Used  Substance and Sexual Activity  . Alcohol use: Yes    Comment: social drinker, every weekend  . Drug use: No  . Sexual activity: Not on file  Lifestyle  . Physical activity:    Days per week: Not on  file    Minutes per session: Not on file  . Stress: Not on file  Relationships  . Social connections:    Talks on phone: Not on file    Gets together: Not on file    Attends religious service: Not on file    Active member of club or organization: Not on file    Attends meetings of clubs or organizations: Not on file    Relationship status: Not on file  Other Topics Concern  . Not on file  Social History Narrative   Occupation: administrative Asst. [employer]   Married   Regular exercise- not recently    etoh 1 per day or so    HH of 2     Pets cats and dog    No ets.   Considering  retire ing in a couple years.   [place] sleep interrupted  With late night noise    Outpatient Encounter Medications as of 01/11/2019  Medication Sig  . levothyroxine (SYNTHROID, LEVOTHROID) 88 MCG tablet TAKE ONE TABLET BY MOUTH DAILY  . Magnesium 300 MG CAPS Take 300-600 mg by mouth daily. depends on constipation if takes 300-600 mg  . zolpidem (AMBIEN) 5 MG tablet Take 0.5-1 tablets (2.5-5 mg total) by mouth at bedtime as needed for sleep.  . [DISCONTINUED] Diclofenac Sodium CR (VOLTAREN-XR) 100 MG 24 hr tablet Take 1 tablet (100 mg total) by mouth daily.  . [DISCONTINUED] lidocaine (LIDODERM) 5 % Place 1 patch onto the skin daily. Remove & Discard patch within 12 hours or as directed by MD  . [DISCONTINUED] methocarbamol (ROBAXIN) 500 MG tablet Take 1 tablet (500 mg total) by mouth 2 (two) times daily.  . [DISCONTINUED] predniSONE (DELTASONE) 5 MG tablet Take 5 mg by mouth See admin instructions. 12 day taper started 04/03  . [DISCONTINUED] traMADol (ULTRAM) 50 MG tablet Take 1-2 tablets (50-100 mg total) by mouth every 6 (six) hours as needed for moderate pain or severe pain. (Patient not taking: Reported on 01/05/2018)   No facility-administered encounter medications on file as of 01/11/2019.     EXAM:  BP 120/64 (BP Location: Right Arm, Patient Position: Sitting, Cuff Size: Normal)   Pulse 66    Temp 98.4 F (36.9 C) (Oral)   Ht '5\' 5"'  (1.651 m)   Wt 169 lb 11.2 oz (77 kg)   BMI 28.24 kg/m   Body mass index is 28.24 kg/m.  Physical Exam: Vital signs reviewed GMW:NUUV is a well-developed well-nourished alert cooperative   who appears stated age in no acute distress.  HEENT: normocephalic  atraumatic , Eyes: PERRL EOM's full, conjunctiva pink .. no dc wearing glasses , Nares: paten,t no deformity discharge or tenderness., Ears: no deformity EAC's clear TMs with normal landmarks. Mouth: clear OP, no lesions, edema.  Moist mucous membranes. Dentition in adequate repair. NECK: supple without masses, thyromegaly or bruits. CHEST/PULM:  Clear to auscultation and percussion breath sounds equal no wheeze , rales or rhonchi. No chest wall deformities or tenderness. CV: PMI is nondisplaced, S1 S2 no gallops, murmurs, rubs. Peripheral pulses are full without delay.No JVD .  ABDOMEN: Bowel sounds normal nontender  No guard or rebound, no hepato splenomegal no CVA tenderness.   Extremtities:  No clubbing cyanosis or edema, no acute joint swelling or redness no focal atrophy NEURO:  Oriented x3, cranial nerves 3-12 appear to be intact, no obvious focal weakness,gait within normal limits no abnormal reflexes or asymmetrical SKIN: No acute rashes normal turgor, color, no bruising or petechiae. PSYCH: Oriented, good eye contact, no obvious depression anxiety, cognition and judgment appear normal. LN: no cervical axillary inguinal adenopathy No noted deficits in memory, attention, and speech.     ASSESSMENT AND PLAN:  Discussed the following assessment and plan:  Visit for preventive health examination  Medication management - Plan: Basic metabolic panel, CBC with Differential/Platelet, Hepatic function panel, Lipid panel, TSH  Hypothyroidism, unspecified type - Plan: Basic metabolic panel, CBC with Differential/Platelet, Hepatic function panel, Lipid panel, TSH  Elevated LDL cholesterol  level - Plan: Basic metabolic panel, CBC with Differential/Platelet, Hepatic function panel, Lipid panel, TSH  Itchy eyes  Status post hip replacement, unspecified laterality Monitor her thyroid discussion about her ongoing eye symptoms healthy lifestyle Shingrix vaccine etc.  If all is well and up-to-date on preventive visits goal to get BMI closer to 2526 CPX lab monitoring in 1 year or as needed. Patient Care Team: Kerri-Anne Haeberle, Standley Brooking, MD as PCP - General Jola Baptist, DC (Chiropractic Medicine) Paralee Cancel, MD as Consulting Physician (Orthopedic Surgery)  Patient Instructions  Continue lifestyle intervention healthy eating and exercise .  Consider no contacts for 3-4 weeks  And seeing  Another eye doc or allergerist .    Will notify you  of labs when available.  prvnar 23 today  If all ok then yrealry check up    Preventive Care 65 Years and Older, Female Preventive care refers to lifestyle choices and visits with your health care provider that can promote health and wellness. What does preventive care include?  A yearly physical exam. This is also called an annual well check.  Dental exams once or twice a year.  Routine eye exams. Ask your health care provider how often you should have your eyes checked.  Personal lifestyle choices, including: ? Daily care of your teeth and gums. ? Regular physical activity. ? Eating a healthy diet. ? Avoiding tobacco and drug use. ? Limiting alcohol use. ? Practicing safe sex. ? Taking low-dose aspirin every day. ? Taking vitamin and mineral supplements as recommended by your health care provider. What happens during an annual well check? The services and screenings done by your health care provider during your annual well check will depend on your age, overall health, lifestyle risk factors, and family history of disease. Counseling Your health care provider may ask you questions about your:  Alcohol use.  Tobacco use.  Drug  use.  Emotional well-being.  Home and relationship well-being.  Sexual activity.  Eating habits.  History of falls.  Memory and ability to understand (cognition).  Work and work Statistician.  Reproductive health.  Screening You may have the following tests or measurements:  Height, weight, and BMI.  Blood pressure.  Lipid and cholesterol levels. These may be checked every 5 years, or more frequently if you are over 17 years old.  Skin check.  Lung cancer screening. You may have this screening every year starting at age 34 if you have a 30-pack-year history of smoking and currently smoke or have quit within the past 15 years.  Colorectal cancer screening. All adults should have this screening starting at age 22 and continuing until age 97. You will have tests every 1-10 years, depending on your results and the type of screening test. People at increased risk should start screening at an earlier age. Screening tests may include: ? Guaiac-based fecal occult blood testing. ? Fecal immunochemical test (FIT). ? Stool DNA test. ? Virtual colonoscopy. ? Sigmoidoscopy. During this test, a flexible tube with a tiny camera (sigmoidoscope) is used to examine your rectum and lower colon. The sigmoidoscope is inserted through your anus into your rectum and lower colon. ? Colonoscopy. During this test, a long, thin, flexible tube with a tiny camera (colonoscope) is used to examine your entire colon and rectum.  Hepatitis C blood test.  Hepatitis B blood test.  Sexually transmitted disease (STD) testing.  Diabetes screening. This is done by checking your blood sugar (glucose) after you have not eaten for a while (fasting). You may have this done every 1-3 years.  Bone density scan. This is done to screen for osteoporosis. You may have this done starting at age 36.  Mammogram. This may be done every 1-2 years. Talk to your health care provider about how often you should have regular  mammograms. Talk with your health care provider about your test results, treatment options, and if necessary, the need for more tests. Vaccines Your health care provider may recommend certain vaccines, such as:  Influenza vaccine. This is recommended every year.  Tetanus, diphtheria, and acellular pertussis (Tdap, Td) vaccine. You may need a Td booster every 10 years.  Varicella vaccine. You may need this if you have not been vaccinated.  Zoster vaccine. You may need this after age 81.  Measles, mumps, and rubella (MMR) vaccine. You may need at least one dose of MMR if you were born in 1957 or later. You may also need a second dose.  Pneumococcal 13-valent conjugate (PCV13) vaccine. One dose is recommended after age 19.  Pneumococcal polysaccharide (PPSV23) vaccine. One dose is recommended after age 29.  Meningococcal vaccine. You may need this if you have certain conditions.  Hepatitis A vaccine. You may need this if you have certain conditions or if you travel or work in places where you may be exposed to hepatitis A.  Hepatitis B vaccine. You may need this if you have certain conditions or if you travel or work in places where you may be exposed to hepatitis B.  Haemophilus influenzae type b (Hib) vaccine. You may need this if you have certain conditions. Talk to your health care provider about which screenings and vaccines you need and how often you need them. This information is not intended to replace advice given to you by your health care provider. Make sure you discuss any questions you have with your health care provider. Document Released: 12/15/2015 Document Revised: 01/08/2018 Document Reviewed: 09/19/2015 Elsevier Interactive Patient Education  2019 [place]. Johnmark Geiger M.D.

## 2019-01-11 ENCOUNTER — Ambulatory Visit (INDEPENDENT_AMBULATORY_CARE_PROVIDER_SITE_OTHER): Payer: Medicare Other | Admitting: Internal Medicine

## 2019-01-11 ENCOUNTER — Encounter: Payer: Self-pay | Admitting: Internal Medicine

## 2019-01-11 VITALS — BP 120/64 | HR 66 | Temp 98.4°F | Ht 65.0 in | Wt 169.7 lb

## 2019-01-11 DIAGNOSIS — Z23 Encounter for immunization: Secondary | ICD-10-CM | POA: Diagnosis not present

## 2019-01-11 DIAGNOSIS — Z0000 Encounter for general adult medical examination without abnormal findings: Secondary | ICD-10-CM

## 2019-01-11 DIAGNOSIS — E039 Hypothyroidism, unspecified: Secondary | ICD-10-CM | POA: Diagnosis not present

## 2019-01-11 DIAGNOSIS — Z96649 Presence of unspecified artificial hip joint: Secondary | ICD-10-CM

## 2019-01-11 DIAGNOSIS — Z79899 Other long term (current) drug therapy: Secondary | ICD-10-CM

## 2019-01-11 DIAGNOSIS — R6889 Other general symptoms and signs: Secondary | ICD-10-CM | POA: Diagnosis not present

## 2019-01-11 DIAGNOSIS — E7800 Pure hypercholesterolemia, unspecified: Secondary | ICD-10-CM

## 2019-01-11 LAB — LIPID PANEL
Cholesterol: 183 mg/dL (ref 0–200)
HDL: 38.8 mg/dL — AB (ref >39.00)
LDL CALC: 127 mg/dL — AB (ref 0–99)
NONHDL: 144.38
Total CHOL/HDL Ratio: 5
Triglycerides: 85 mg/dL (ref 0.0–149.0)
VLDL: 17 mg/dL (ref 0.0–40.0)

## 2019-01-11 LAB — HEPATIC FUNCTION PANEL
ALBUMIN: 4.2 g/dL (ref 3.5–5.2)
ALT: 29 U/L (ref 0–35)
AST: 15 U/L (ref 0–37)
Alkaline Phosphatase: 61 U/L (ref 39–117)
BILIRUBIN DIRECT: 0.1 mg/dL (ref 0.0–0.3)
BILIRUBIN TOTAL: 0.6 mg/dL (ref 0.2–1.2)
Total Protein: 6.5 g/dL (ref 6.0–8.3)

## 2019-01-11 LAB — CBC WITH DIFFERENTIAL/PLATELET
BASOS ABS: 0.1 10*3/uL (ref 0.0–0.1)
Basophils Relative: 1.4 % (ref 0.0–3.0)
EOS ABS: 0.1 10*3/uL (ref 0.0–0.7)
Eosinophils Relative: 1.9 % (ref 0.0–5.0)
HCT: 44.6 % (ref 36.0–46.0)
Hemoglobin: 14.9 g/dL (ref 12.0–15.0)
LYMPHS PCT: 27.7 % (ref 12.0–46.0)
Lymphs Abs: 1.2 10*3/uL (ref 0.7–4.0)
MCHC: 33.4 g/dL (ref 30.0–36.0)
MCV: 89.1 fl (ref 78.0–100.0)
Monocytes Absolute: 0.4 10*3/uL (ref 0.1–1.0)
Monocytes Relative: 8.9 % (ref 3.0–12.0)
NEUTROS PCT: 60.1 % (ref 43.0–77.0)
Neutro Abs: 2.5 10*3/uL (ref 1.4–7.7)
PLATELETS: 209 10*3/uL (ref 150.0–400.0)
RBC: 5.01 Mil/uL (ref 3.87–5.11)
RDW: 13.5 % (ref 11.5–15.5)
WBC: 4.2 10*3/uL (ref 4.0–10.5)

## 2019-01-11 LAB — TSH: TSH: 0.5 u[IU]/mL (ref 0.35–4.50)

## 2019-01-11 LAB — BASIC METABOLIC PANEL
BUN: 22 mg/dL (ref 6–23)
CALCIUM: 9.9 mg/dL (ref 8.4–10.5)
CO2: 29 mEq/L (ref 19–32)
CREATININE: 1 mg/dL (ref 0.40–1.20)
Chloride: 106 mEq/L (ref 96–112)
GFR: 55.33 mL/min — ABNORMAL LOW (ref >60.00)
Glucose, Bld: 78 mg/dL (ref 70–99)
Potassium: 4.6 mEq/L (ref 3.5–5.1)
Sodium: 142 mEq/L (ref 135–145)

## 2019-01-11 NOTE — Patient Instructions (Signed)
Continue lifestyle intervention healthy eating and exercise .  Consider no contacts for 3-4 weeks  And seeing  Another eye doc or allergerist .    Will notify you  of labs when available.  prvnar 23 today  If all ok then yrealry check up    Preventive Care 65 Years and Older, Female Preventive care refers to lifestyle choices and visits with your health care provider that can promote health and wellness. What does preventive care include?  A yearly physical exam. This is also called an annual well check.  Dental exams once or twice a year.  Routine eye exams. Ask your health care provider how often you should have your eyes checked.  Personal lifestyle choices, including: ? Daily care of your teeth and gums. ? Regular physical activity. ? Eating a healthy diet. ? Avoiding tobacco and drug use. ? Limiting alcohol use. ? Practicing safe sex. ? Taking low-dose aspirin every day. ? Taking vitamin and mineral supplements as recommended by your health care provider. What happens during an annual well check? The services and screenings done by your health care provider during your annual well check will depend on your age, overall health, lifestyle risk factors, and family history of disease. Counseling Your health care provider may ask you questions about your:  Alcohol use.  Tobacco use.  Drug use.  Emotional well-being.  Home and relationship well-being.  Sexual activity.  Eating habits.  History of falls.  Memory and ability to understand (cognition).  Work and work Statistician.  Reproductive health.  Screening You may have the following tests or measurements:  Height, weight, and BMI.  Blood pressure.  Lipid and cholesterol levels. These may be checked every 5 years, or more frequently if you are over 66 years old.  Skin check.  Lung cancer screening. You may have this screening every year starting at age 75 if you have a 30-pack-year history of smoking  and currently smoke or have quit within the past 15 years.  Colorectal cancer screening. All adults should have this screening starting at age 63 and continuing until age 51. You will have tests every 1-10 years, depending on your results and the type of screening test. People at increased risk should start screening at an earlier age. Screening tests may include: ? Guaiac-based fecal occult blood testing. ? Fecal immunochemical test (FIT). ? Stool DNA test. ? Virtual colonoscopy. ? Sigmoidoscopy. During this test, a flexible tube with a tiny camera (sigmoidoscope) is used to examine your rectum and lower colon. The sigmoidoscope is inserted through your anus into your rectum and lower colon. ? Colonoscopy. During this test, a long, thin, flexible tube with a tiny camera (colonoscope) is used to examine your entire colon and rectum.  Hepatitis C blood test.  Hepatitis B blood test.  Sexually transmitted disease (STD) testing.  Diabetes screening. This is done by checking your blood sugar (glucose) after you have not eaten for a while (fasting). You may have this done every 1-3 years.  Bone density scan. This is done to screen for osteoporosis. You may have this done starting at age 76.  Mammogram. This may be done every 1-2 years. Talk to your health care provider about how often you should have regular mammograms. Talk with your health care provider about your test results, treatment options, and if necessary, the need for more tests. Vaccines Your health care provider may recommend certain vaccines, such as:  Influenza vaccine. This is recommended every year.  Tetanus,  diphtheria, and acellular pertussis (Tdap, Td) vaccine. You may need a Td booster every 10 years.  Varicella vaccine. You may need this if you have not been vaccinated.  Zoster vaccine. You may need this after age 68.  Measles, mumps, and rubella (MMR) vaccine. You may need at least one dose of MMR if you were born  in 1957 or later. You may also need a second dose.  Pneumococcal 13-valent conjugate (PCV13) vaccine. One dose is recommended after age 3.  Pneumococcal polysaccharide (PPSV23) vaccine. One dose is recommended after age 50.  Meningococcal vaccine. You may need this if you have certain conditions.  Hepatitis A vaccine. You may need this if you have certain conditions or if you travel or work in places where you may be exposed to hepatitis A.  Hepatitis B vaccine. You may need this if you have certain conditions or if you travel or work in places where you may be exposed to hepatitis B.  Haemophilus influenzae type b (Hib) vaccine. You may need this if you have certain conditions. Talk to your health care provider about which screenings and vaccines you need and how often you need them. This information is not intended to replace advice given to you by your health care provider. Make sure you discuss any questions you have with your health care provider. Document Released: 12/15/2015 Document Revised: 01/08/2018 Document Reviewed: 09/19/2015 Elsevier Interactive Patient Education  2019 [employer].

## 2019-01-11 NOTE — Addendum Note (Signed)
Addended by: Modena Morrow R on: 01/11/2019 01:17 PM   Modules accepted: Orders

## 2019-01-12 ENCOUNTER — Other Ambulatory Visit: Payer: Self-pay

## 2019-01-12 DIAGNOSIS — R8290 Unspecified abnormal findings in urine: Secondary | ICD-10-CM

## 2019-01-12 DIAGNOSIS — E7800 Pure hypercholesterolemia, unspecified: Secondary | ICD-10-CM

## 2019-03-17 ENCOUNTER — Other Ambulatory Visit: Payer: Self-pay | Admitting: Internal Medicine

## 2019-05-16 IMAGING — CR DG HIP (WITH OR WITHOUT PELVIS) 2-3V*L*
3 series · 3 of 3 positions shown · non-contrast
Comparison: 12/31/2016

CLINICAL DATA: Left hip pain after fall last evening.

EXAM:
DG HIP (WITH OR WITHOUT PELVIS) 2-3V LEFT

[x pelvis]
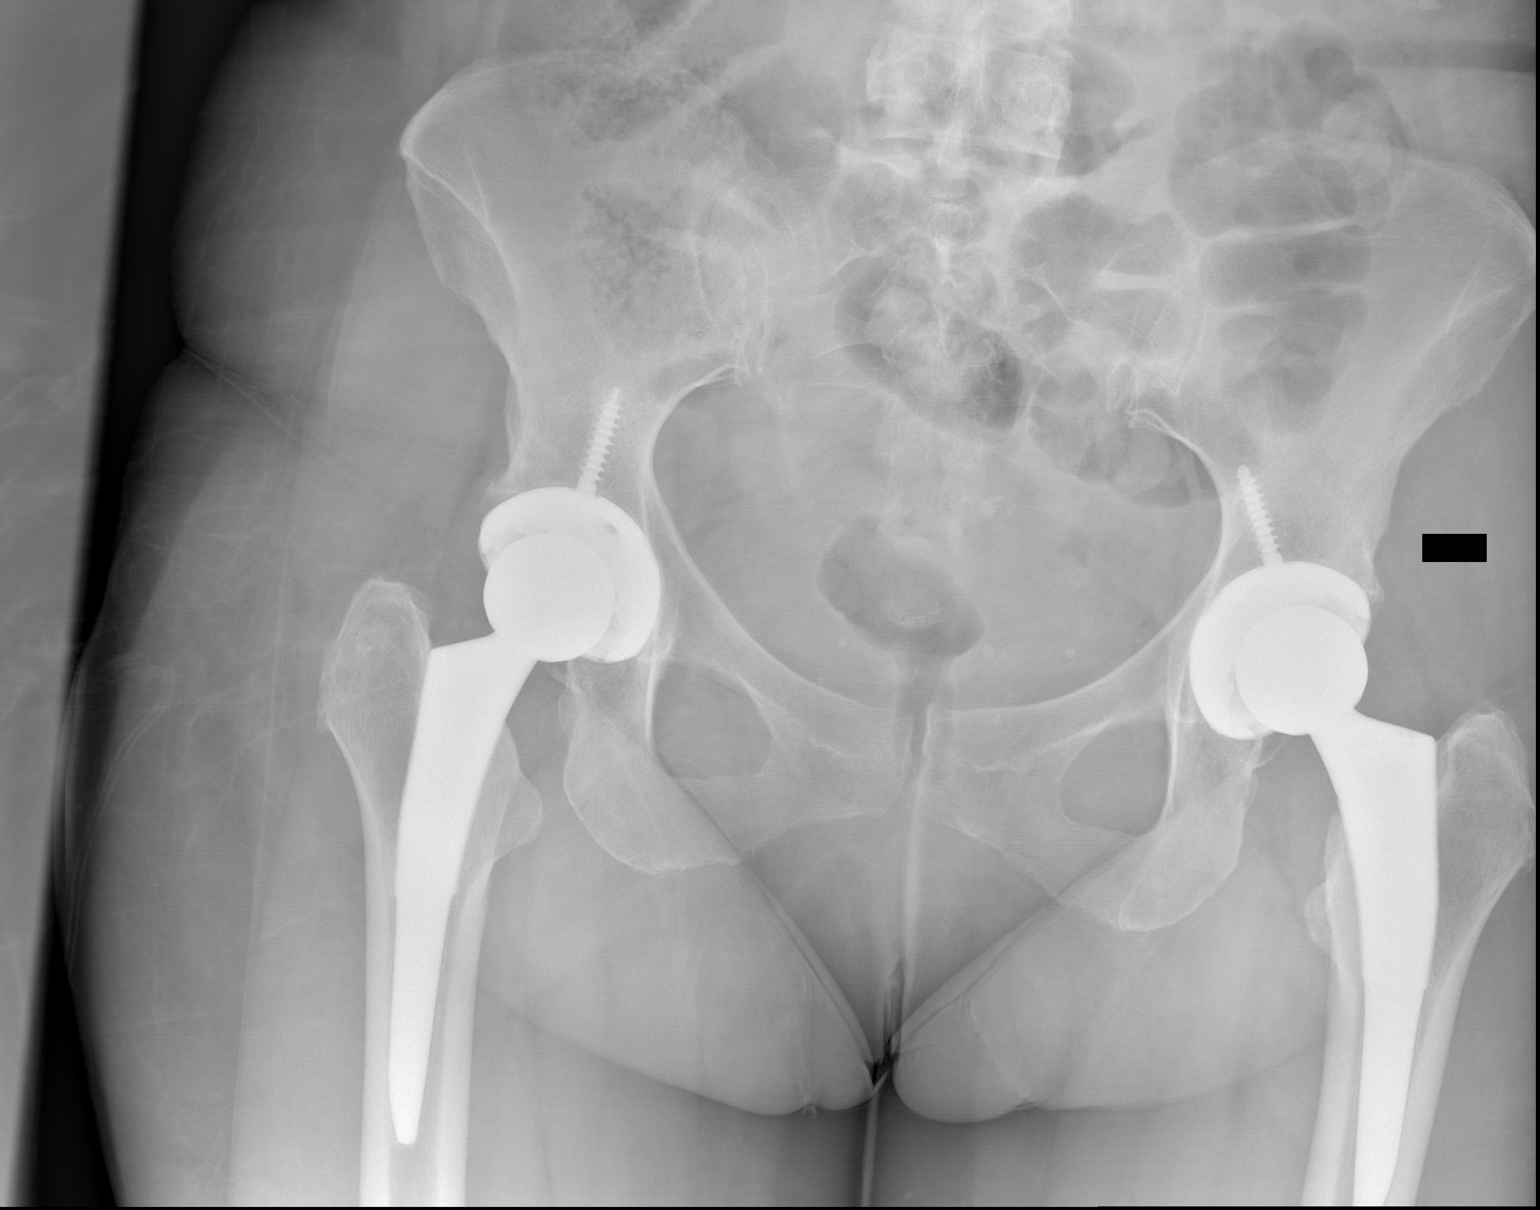

[x hip ap left]
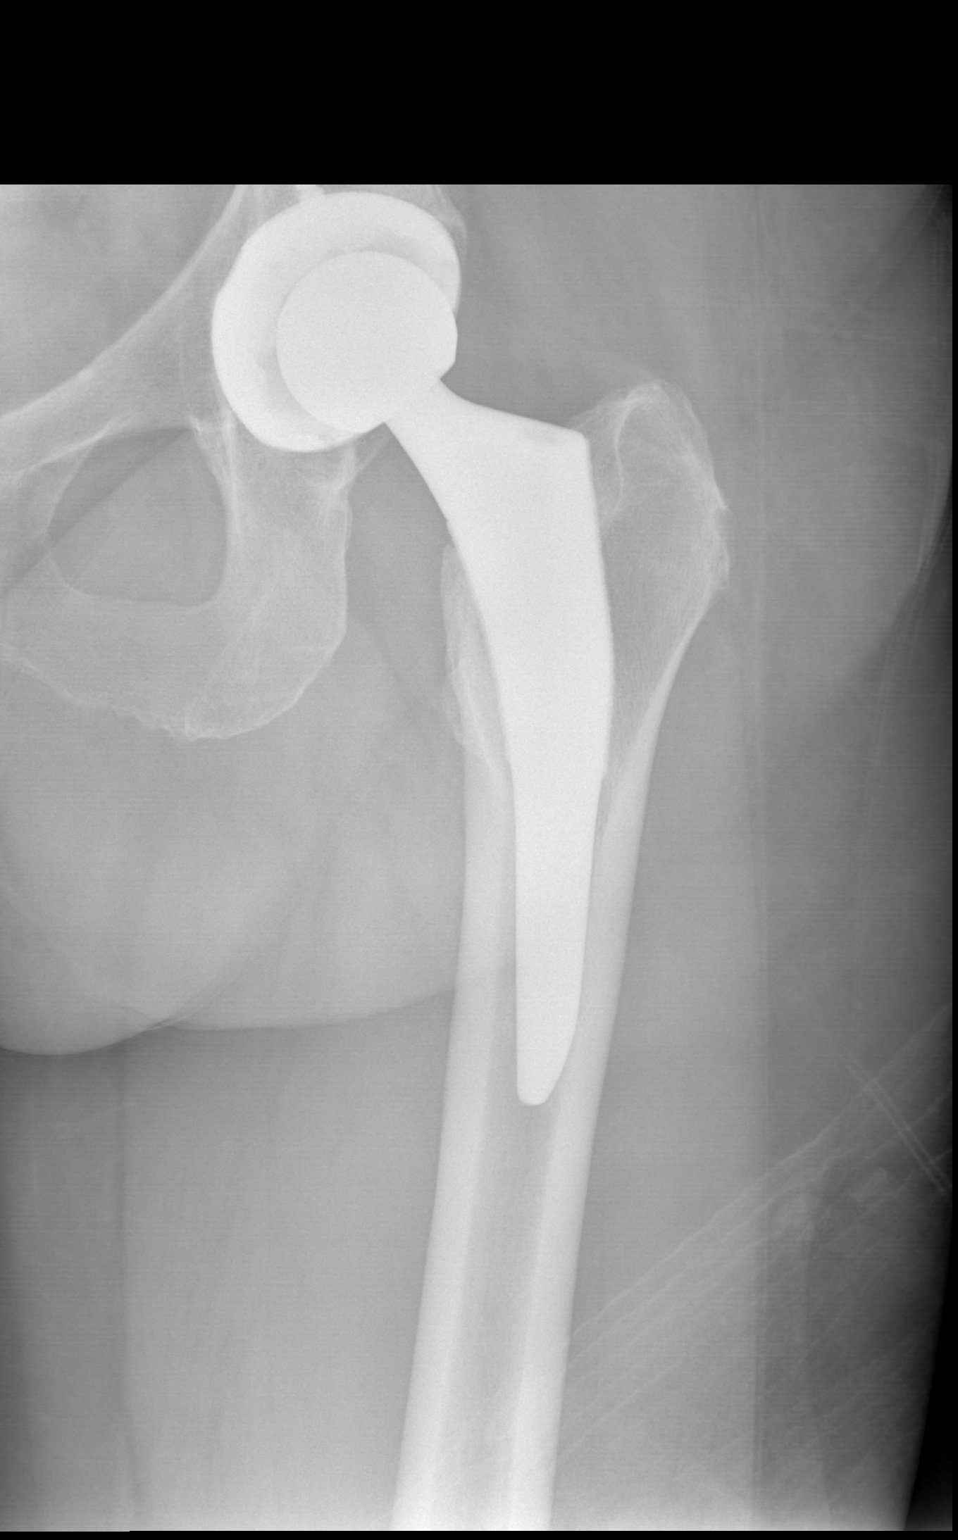

[x hip lat left]
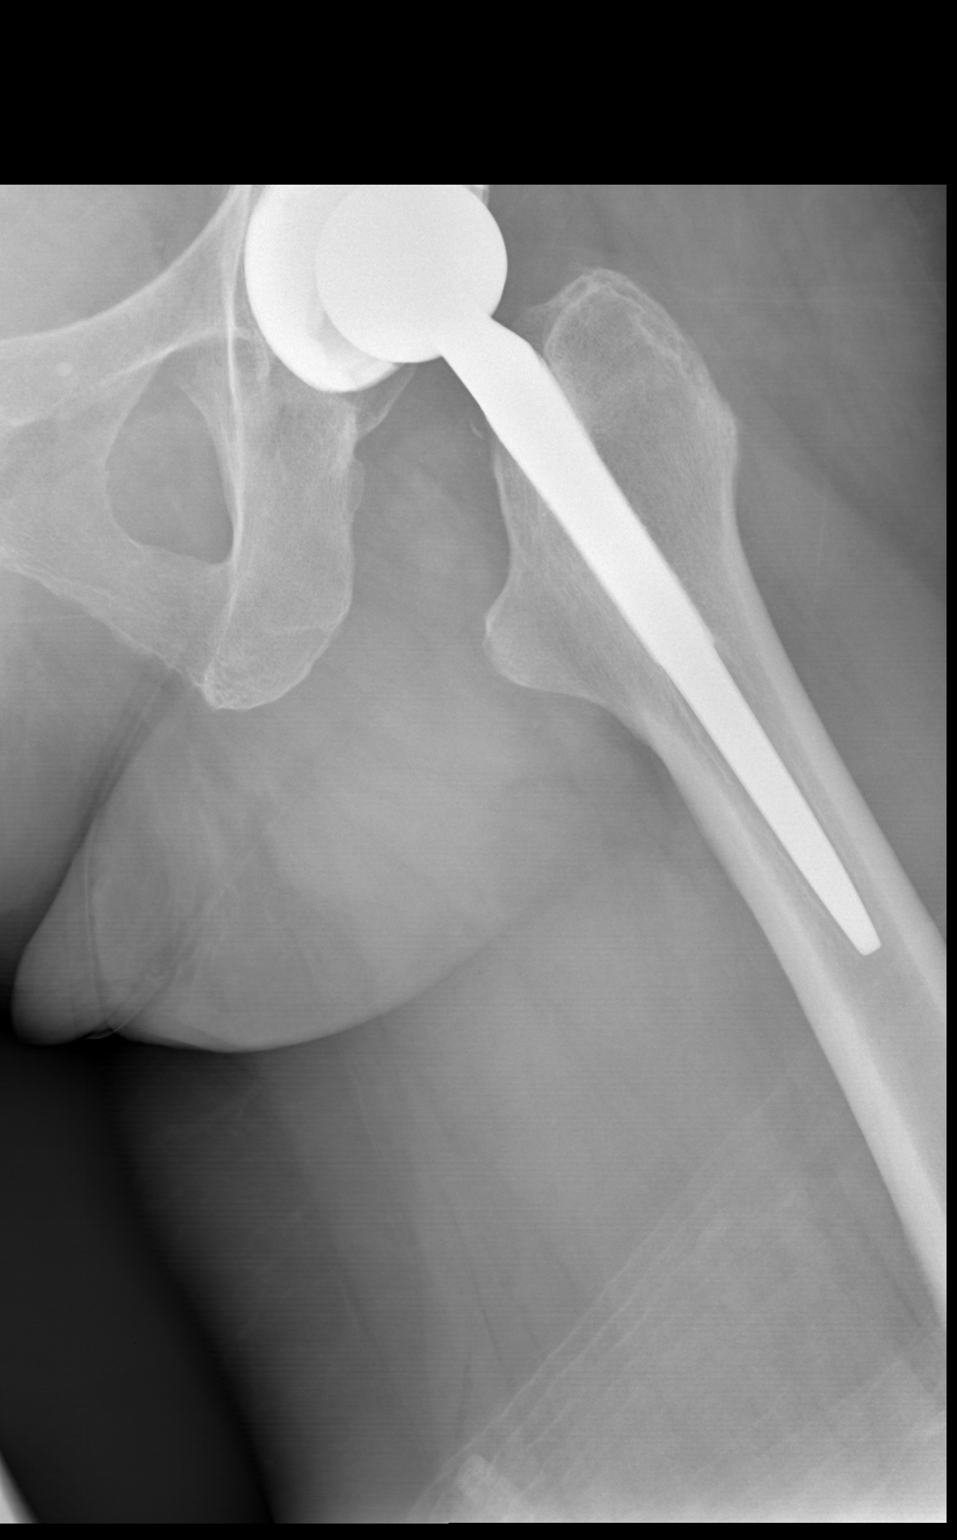

[3 of 3 positions shown; findings below may reference images not displayed]

FINDINGS: Intact bilateral uncemented total hip arthroplasties. No joint
dislocations. There is no evidence of arthropathy or other focal
bone abnormality.
IMPRESSION: Negative for acute fracture or malalignment of either hip. Hardware
failure status post bilateral total hip arthroplasties. Intact
pelvis.

## 2019-05-16 IMAGING — CR DG CHEST 2V
2 series · 2 of 2 positions shown · non-contrast
Comparison: None.

CLINICAL DATA: Pain after fall last evening.

EXAM:
CHEST - 2 VIEW

[x chest ap]
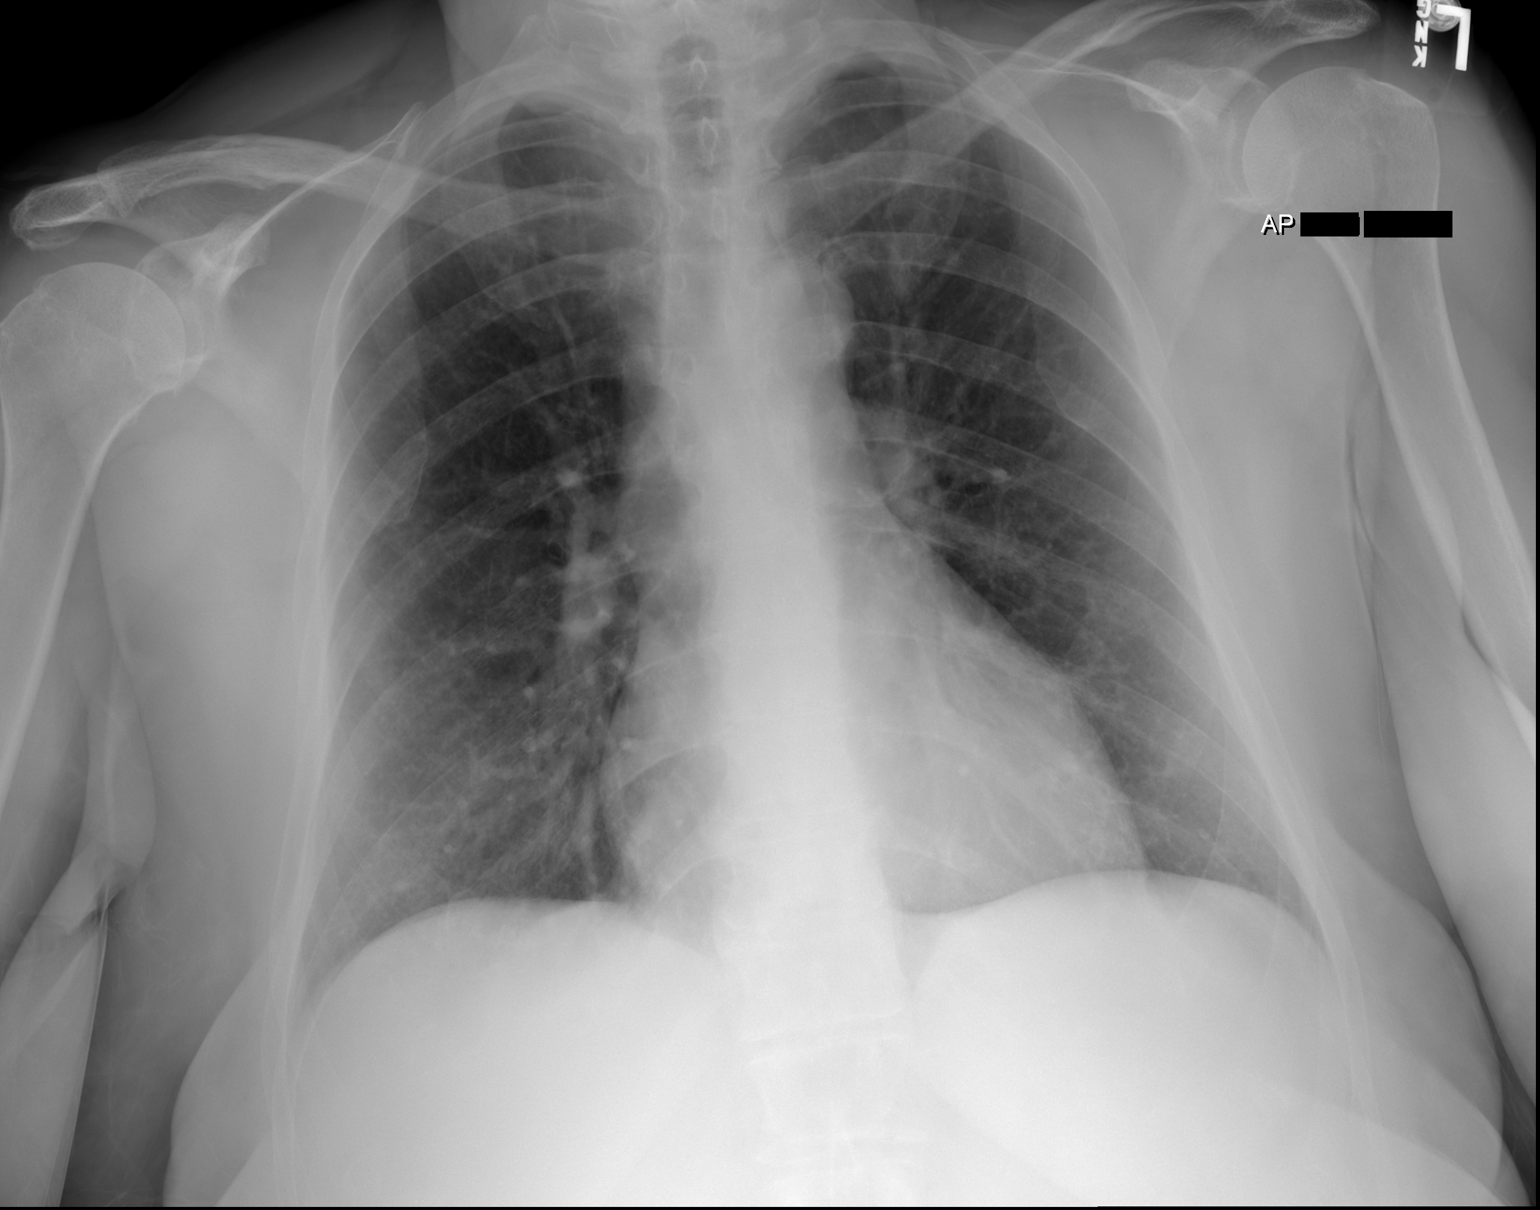

[w chest lat]
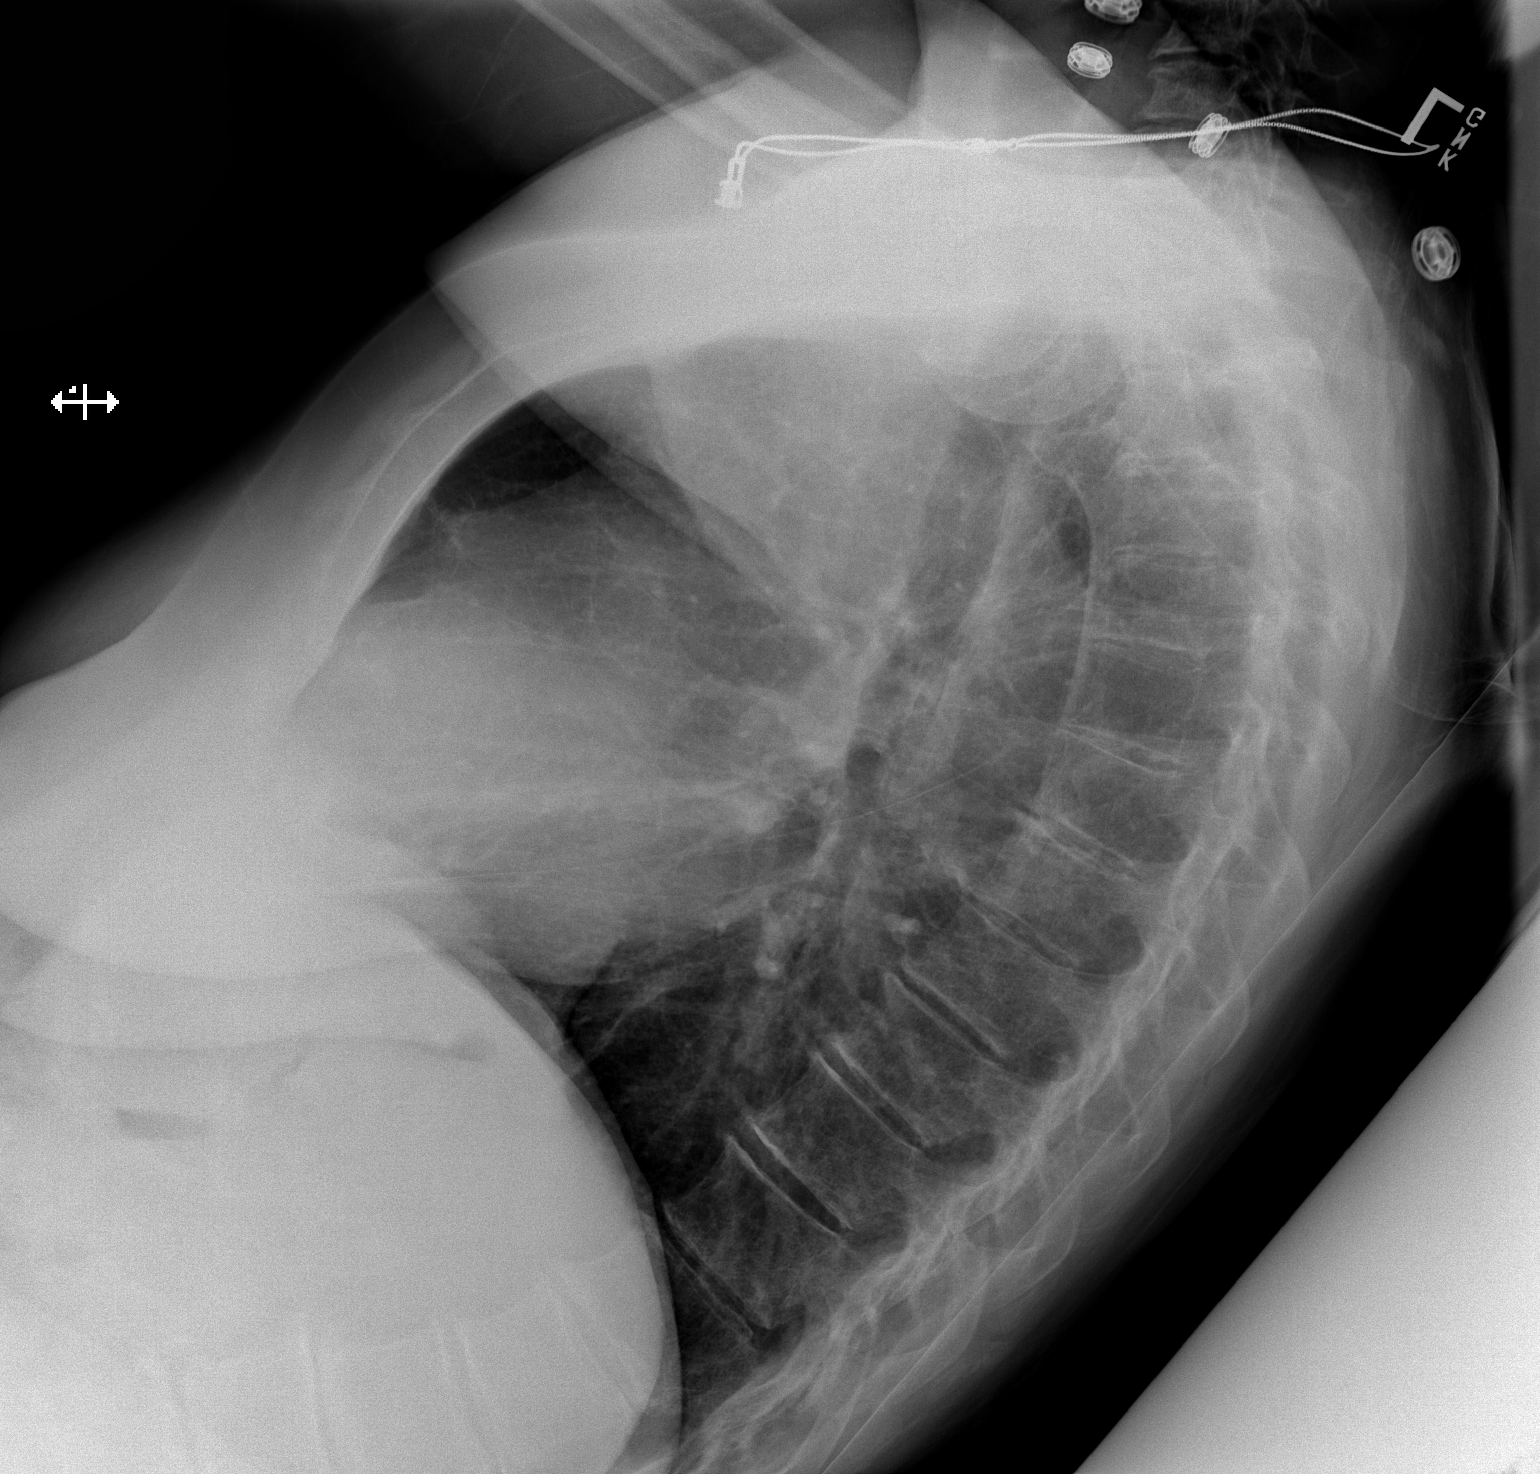

[2 of 2 positions shown; findings below may reference images not displayed]

FINDINGS: The heart size and mediastinal contours are within normal limits. No
aortic aneurysm. Minimal aortic atherosclerosis at the arch. Both
lungs are clear. Mild degenerative change along the included
thoracolumbar spine.
IMPRESSION: No active cardiopulmonary disease.

## 2019-06-12 ENCOUNTER — Other Ambulatory Visit: Payer: Self-pay | Admitting: Internal Medicine

## 2019-09-10 ENCOUNTER — Other Ambulatory Visit: Payer: Self-pay | Admitting: Internal Medicine

## 2019-10-12 ENCOUNTER — Other Ambulatory Visit: Payer: Self-pay

## 2019-10-12 ENCOUNTER — Encounter: Payer: Self-pay | Admitting: Internal Medicine

## 2019-10-12 ENCOUNTER — Telehealth (INDEPENDENT_AMBULATORY_CARE_PROVIDER_SITE_OTHER): Payer: Medicare Other | Admitting: Internal Medicine

## 2019-10-12 DIAGNOSIS — Z79899 Other long term (current) drug therapy: Secondary | ICD-10-CM

## 2019-10-12 DIAGNOSIS — H579 Unspecified disorder of eye and adnexa: Secondary | ICD-10-CM | POA: Diagnosis not present

## 2019-10-12 MED ORDER — LEVOCETIRIZINE DIHYDROCHLORIDE 5 MG PO TABS: 5.0000 mg | ORAL_TABLET | Freq: Every evening | ORAL | 10 refills | Status: DC

## 2019-10-12 NOTE — Progress Notes (Signed)
Virtual Visit via Video Note  I connected with@ on 10/12/19 at  2:00 PM EST by a video enabled telemedicine application and verified that I am speaking with the correct person using two identifiers. Location patient: home Location provider:work  office Persons participating in the virtual visit: patient, provider  [place] national recommendations  regarding COVID 19 pandemic   video visit is advised over in office visit for this patient.  Patient aware  of the limitations of evaluation and management by telemedicine and  availability of in person appointments. and agreed to proceed.   HPI: Denise Garrett presents for video visit She has seen alergist about 11 mos ago dr Donneta Romberg and all prick tests were negative but she gets  Red irritated eyes every time rains or rainly humid season     Has contacts and takes them out,     No dx   But was given  xyzal to take at night and this has helped her  Eye sx when flares ( instead of  Steroids and other per eye doc)  No se of med taking at night  Would ask [place] to rx  Instead of allergist since no other  Med eval to be done at this time,   Last PV was 32 20   ROS: See pertinent positives and negatives per HPI.  Past Medical History:  Diagnosis Date  . Anemia    hx of   . Arthritis   . Hx of colonic polyps   . Hypothyroidism   . Trigger finger    left ring finger    Past Surgical History:  Procedure Laterality Date  . bunion removed    . COLONOSCOPY    . EXPLORATORY LAPAROTOMY    . POLYPECTOMY    . TOTAL HIP ARTHROPLASTY Right 10/10/2015   Procedure: TOTAL RIGHT HIP ARTHROPLASTY ANTERIOR APPROACH;  Surgeon: Paralee Cancel, MD;  Location: [facility];  Service: Orthopedics;  Laterality: Right;  . TOTAL HIP ARTHROPLASTY Left 12/31/2016   Procedure: LEFT TOTAL HIP ARTHROPLASTY ANTERIOR APPROACH;  Surgeon: Paralee Cancel, MD;  Location: [facility];  Service: Orthopedics;  Laterality: Left;  Requests 70 mins  . WISDOM TOOTH EXTRACTION      Family History   Problem Relation Age of Onset  . Heart attack Father   . Coronary artery disease Father   . Coronary artery disease Brother        quad bypass age 40  . Colon cancer Neg Hx   . Esophageal cancer Neg Hx   . Prostate cancer Neg Hx   . Rectal cancer Neg Hx     Social History   Tobacco Use  . Smoking status: Never Smoker  . Smokeless tobacco: Never Used  Substance Use Topics  . Alcohol use: Yes    Comment: social drinker, every weekend  . Drug use: No      Current Outpatient Medications:  .  levocetirizine (XYZAL) 5 MG tablet, Take 1 tablet (5 mg total) by mouth every evening. For allergy, Disp: 30 tablet, Rfl: 10 .  levothyroxine (SYNTHROID) 88 MCG tablet, TAKE ONE TABLET BY MOUTH DAILY, Disp: 90 tablet, Rfl: 0 .  Magnesium 300 MG CAPS, Take 300-600 mg by mouth daily. depends on constipation if takes 300-600 mg, Disp: , Rfl:  .  zolpidem (AMBIEN) 5 MG tablet, Take 0.5-1 tablets (2.5-5 mg total) by mouth at bedtime as needed for sleep., Disp: 15 tablet, Rfl: 0  EXAM: BP Readings from Last 3 Encounters:  01/11/19 120/64  03/08/18 (!) 142/72  01/05/18 128/76    VITALS per patient if applicable:  GENERAL: alert, oriented, appears well and in no acute distress  HEENT: atraumatic, conjunttiva clear, no obvious abnormalities on inspection of external nose and ears  NECK: normal movements of the head and neck  LUNGS: on inspection no signs of respiratory distress, breathing rate appears normal, no obvious gross SOB, gasping or wheezing  CV: no obvious cyanosis  MS: moves all visible extremities without noticeable abnormality  PSYCH/NEURO: pleasant and cooperative, no obvious depression or anxiety, speech and thought processing grossly intact Lab Results  Component Value Date   WBC 4.2 01/11/2019   HGB 14.9 01/11/2019   HCT 44.6 01/11/2019   PLT 209.0 01/11/2019   GLUCOSE 78 01/11/2019   CHOL 183 01/11/2019   TRIG 85.0 01/11/2019   HDL 38.80 (L) 01/11/2019    LDLCALC 127 (H) 01/11/2019   ALT 29 01/11/2019   AST 15 01/11/2019   NA 142 01/11/2019   K 4.6 01/11/2019   CL 106 01/11/2019   CREATININE 1.00 01/11/2019   BUN 22 01/11/2019   CO2 29 01/11/2019   TSH 0.50 01/11/2019   INR 1.01 09/29/2015    ASSESSMENT AND PLAN:  Discussed the following assessment and plan:    ICD-10-CM   1. Allergic eye reaction  H57.9    recurrent  see text eval Dr Donneta Romberg  2. Medication management  Z79.899    rx xyzal   rx  To use as needed caution with contacts  To not use  if eyes are red  Counseled.   Expectant management and discussion of plan and treatment with opportunity to ask questions and all were answered. The patient agreed with the plan and demonstrated an understanding of the instructions.   Advised to call back or seek an in-person evaluation if worsening  or having  further concerns . In interim      Shanon Ace, MD

## 2019-10-12 NOTE — Telephone Encounter (Signed)
Pt has been made an appt to discuss

## 2019-10-14 LAB — HM MAMMOGRAPHY

## 2019-10-21 ENCOUNTER — Encounter: Payer: Self-pay | Admitting: Internal Medicine

## 2019-11-03 LAB — HM DEXA SCAN

## 2019-11-25 ENCOUNTER — Encounter: Payer: Self-pay | Admitting: Internal Medicine

## 2019-11-30 ENCOUNTER — Encounter: Payer: Self-pay | Admitting: Internal Medicine

## 2019-12-01 ENCOUNTER — Telehealth: Payer: Self-pay | Admitting: *Deleted

## 2019-12-01 NOTE — Telephone Encounter (Signed)
Patient scheduled for AWV for 01/11/2019.  Copied from [place] [id number]. Topic: Medicare AWV >> Dec 01, 2019  3:22 PM Reyne Dumas L wrote: Reason for CRM:   Pt states she needs to schedule her annual wellness exam.

## 2019-12-09 ENCOUNTER — Other Ambulatory Visit: Payer: Self-pay | Admitting: Internal Medicine

## 2019-12-29 ENCOUNTER — Encounter (INDEPENDENT_AMBULATORY_CARE_PROVIDER_SITE_OTHER): Payer: Self-pay | Admitting: Ophthalmology

## 2020-01-04 HISTORY — PX: HAND SURGERY: SHX662

## 2020-01-05 ENCOUNTER — Other Ambulatory Visit: Payer: Self-pay

## 2020-01-05 ENCOUNTER — Encounter (INDEPENDENT_AMBULATORY_CARE_PROVIDER_SITE_OTHER): Payer: Self-pay | Admitting: Ophthalmology

## 2020-01-05 DIAGNOSIS — H2513 Age-related nuclear cataract, bilateral: Secondary | ICD-10-CM

## 2020-01-05 DIAGNOSIS — H43813 Vitreous degeneration, bilateral: Secondary | ICD-10-CM

## 2020-01-05 DIAGNOSIS — H35373 Puckering of macula, bilateral: Secondary | ICD-10-CM

## 2020-01-12 ENCOUNTER — Other Ambulatory Visit: Payer: Self-pay

## 2020-01-12 ENCOUNTER — Ambulatory Visit (INDEPENDENT_AMBULATORY_CARE_PROVIDER_SITE_OTHER): Payer: Medicare PPO

## 2020-01-12 VITALS — BP 144/80 | Temp 97.7°F | Ht 65.0 in | Wt 177.6 lb

## 2020-01-12 DIAGNOSIS — Z0000 Encounter for general adult medical examination without abnormal findings: Secondary | ICD-10-CM

## 2020-01-12 NOTE — Patient Instructions (Addendum)
Denise Garrett , Thank you for taking time to come for your Medicare Wellness Visit. I appreciate your ongoing commitment to your health goals. Please review the following plan we discussed and let me know if I can assist you in the future.   Screening recommendations/referrals: Colorectal Screening: colonoscopy completed 01/18/2013; past due since 01/18/2018; patient declines to repeat at this time.  Mammogram: completed 10/14/2019; due 10/14/2020. Bone Density: completed 11/03/2019; due 11/03/2024.   Vision and Dental Exams: Recommended annual ophthalmology exams for early detection of glaucoma and other disorders of the eye. Had eye exam in Feb 2021.  Recommended annual dental exams for proper oral hygiene. She sees dentist every six months.  Diabetic Exams: Diabetic Eye Exam: N/A Diabetic Foot Exam: N/A  Vaccinations: Influenza vaccine: completed 09/23/2019; due again in Fall 2021. Pneumococcal vaccine: completed 01/05/2018 & 01/11/2019. Up to date.  Tdap vaccine: completed 09/25/2010; due again 09/25/2020. Please call your insurance company to determine your out of pocket expense. You may also receive this vaccine at your local pharmacy or Health Dept or our office.  Shingles vaccine: Please complete Shingrix #2   Advanced directives: Advance directives discussed with you today. Please bring a copy of your POA (Power of [place]) and/or Living Will to your next appointment.  Goals: Continue to drink at least 6-8 8oz glasses of water per day.  Recommend to exercise for at least 150 minutes per week.  Recommend to remove any items from the home that may cause slips or trips.  OR  Recommend to decrease portion sizes by eating 3 small healthy meals and at least 2 healthy snacks per day.  Recommend to begin DASH diet (low sodium) as directed below  Next appointment: Please schedule your Annual Wellness Visit with your Nurse Health Advisor in one year.  Preventive Care 68 Years and  Older, Female Preventive care refers to lifestyle choices and visits with your health care provider that can promote health and wellness. What does preventive care include?  A yearly physical exam. This is also called an annual well check.  Dental exams once or twice a year.  Routine eye exams. Ask your health care provider how often you should have your eyes checked.  Personal lifestyle choices, including:  Daily care of your teeth and gums.  Regular physical activity.  Eating a healthy diet.  Avoiding tobacco and drug use.  Limiting alcohol use.  Practicing safe sex.  Taking low-dose aspirin every day if recommended by your health care provider.  Taking vitamin and mineral supplements as recommended by your health care provider. What happens during an annual well check? The services and screenings done by your health care provider during your annual well check will depend on your age, overall health, lifestyle risk factors, and family history of disease. Counseling  Your health care provider may ask you questions about your:  Alcohol use.  Tobacco use.  Drug use.  Emotional well-being.  Home and relationship well-being.  Sexual activity.  Eating habits.  History of falls.  Memory and ability to understand (cognition).  Work and work Statistician.  Reproductive health. Screening  You may have the following tests or measurements:  Height, weight, and BMI.  Blood pressure.  Lipid and cholesterol levels. These may be checked every 5 years, or more frequently if you are over 69 years old.  Skin check.  Lung cancer screening. You may have this screening every year starting at age 30 if you have a 30-pack-year history of smoking and currently  smoke or have quit within the past 15 years.  Fecal occult blood test (FOBT) of the stool. You may have this test every year starting at age 58.  Flexible sigmoidoscopy or colonoscopy. You may have a sigmoidoscopy  every 5 years or a colonoscopy every 10 years starting at age 45.  Hepatitis C blood test.  Hepatitis B blood test.  Sexually transmitted disease (STD) testing.  Diabetes screening. This is done by checking your blood sugar (glucose) after you have not eaten for a while (fasting). You may have this done every 1-3 years.  Bone density scan. This is done to screen for osteoporosis. You may have this done starting at age 18.  Mammogram. This may be done every 1-2 years. Talk to your health care provider about how often you should have regular mammograms. Talk with your health care provider about your test results, treatment options, and if necessary, the need for more tests. Vaccines  Your health care provider may recommend certain vaccines, such as:  Influenza vaccine. This is recommended every year.  Tetanus, diphtheria, and acellular pertussis (Tdap, Td) vaccine. You may need a Td booster every 10 years.  Zoster vaccine. You may need this after age 31.  Pneumococcal 13-valent conjugate (PCV13) vaccine. One dose is recommended after age 76.  Pneumococcal polysaccharide (PPSV23) vaccine. One dose is recommended after age 15. Talk to your health care provider about which screenings and vaccines you need and how often you need them. This information is not intended to replace advice given to you by your health care provider. Make sure you discuss any questions you have with your health care provider. Document Released: 12/15/2015 Document Revised: 08/07/2016 Document Reviewed: 09/19/2015 Elsevier Interactive Patient Education  2017 [employer] Prevention in the Home Falls can cause injuries. They can happen to people of all ages. There are many things you can do to make your home safe and to help prevent falls. What can I do on the outside of my home?  Regularly fix the edges of walkways and driveways and fix any cracks.  Remove anything that might make you trip as you walk  through a door, such as a raised step or threshold.  Trim any bushes or trees on the path to your home.  Use bright outdoor lighting.  Clear any walking paths of anything that might make someone trip, such as rocks or tools.  Regularly check to see if handrails are loose or broken. Make sure that both sides of any steps have handrails.  Any raised decks and porches should have guardrails on the edges.  Have any leaves, snow, or ice cleared regularly.  Use sand or salt on walking paths during winter.  Clean up any spills in your garage right away. This includes oil or grease spills. What can I do in the bathroom?  Use night lights.  Install grab bars by the toilet and in the tub and shower. Do not use towel bars as grab bars.  Use non-skid mats or decals in the tub or shower.  If you need to sit down in the shower, use a plastic, non-slip stool.  Keep the floor dry. Clean up any water that spills on the floor as soon as it happens.  Remove soap buildup in the tub or shower regularly.  Attach bath mats securely with double-sided non-slip rug tape.  Do not have throw rugs and other things on the floor that can make you trip. What can I do in  the bedroom?  Use night lights.  Make sure that you have a light by your bed that is easy to reach.  Do not use any sheets or blankets that are too big for your bed. They should not hang down onto the floor.  Have a firm chair that has side arms. You can use this for support while you get dressed.  Do not have throw rugs and other things on the floor that can make you trip. What can I do in the kitchen?  Clean up any spills right away.  Avoid walking on wet floors.  Keep items that you use a lot in easy-to-reach places.  If you need to reach something above you, use a strong step stool that has a grab bar.  Keep electrical cords out of the way.  Do not use floor polish or wax that makes floors slippery. If you must use wax,  use non-skid floor wax.  Do not have throw rugs and other things on the floor that can make you trip. What can I do with my stairs?  Do not leave any items on the stairs.  Make sure that there are handrails on both sides of the stairs and use them. Fix handrails that are broken or loose. Make sure that handrails are as long as the stairways.  Check any carpeting to make sure that it is firmly attached to the stairs. Fix any carpet that is loose or worn.  Avoid having throw rugs at the top or bottom of the stairs. If you do have throw rugs, attach them to the floor with carpet tape.  Make sure that you have a light switch at the top of the stairs and the bottom of the stairs. If you do not have them, ask someone to add them for you. What else can I do to help prevent falls?  Wear shoes that:  Do not have high heels.  Have rubber bottoms.  Are comfortable and fit you well.  Are closed at the toe. Do not wear sandals.  If you use a stepladder:  Make sure that it is fully opened. Do not climb a closed stepladder.  Make sure that both sides of the stepladder are locked into place.  Ask someone to hold it for you, if possible.  Clearly mark and make sure that you can see:  Any grab bars or handrails.  First and last steps.  Where the edge of each step is.  Use tools that help you move around (mobility aids) if they are needed. These include:  Canes.  Walkers.  Scooters.  Crutches.  Turn on the lights when you go into a dark area. Replace any light bulbs as soon as they burn out.  Set up your furniture so you have a clear path. Avoid moving your furniture around.  If any of your floors are uneven, fix them.  If there are any pets around you, be aware of where they are.  Review your medicines with your doctor. Some medicines can make you feel dizzy. This can increase your chance of falling. Ask your doctor what other things that you can do to help prevent  falls. This information is not intended to replace advice given to you by your health care provider. Make sure you discuss any questions you have with your health care provider. Document Released: 09/14/2009 Document Revised: 04/25/2016 Document Reviewed: 12/23/2014 Elsevier Interactive Patient Education  2017 [employer].

## 2020-01-12 NOTE — Progress Notes (Signed)
Subjective:   Denise Garrett is a 69 y.o. female who presents for an Initial Medicare Annual Wellness Visit.  Denise Garrett is walking 2 days a week for an hour each and also doing sit ups, lifting light weights, and exercising inside 7 days a week. She is cooking all of her meals at home but states she is overeating out of boredom. She reports that she is very lonely now because of the pandemic. Her husband works during the week and she has not seen her children/grandchildren in person in a very long time. She is looking to purchase a home in [place] to be close to her grandchildren and is ready to move as soon as this happens.  Review of Systems    No ROS: Annual Medicare Wellness Initial visit  Cardiac Risk Factors include: dyslipidemia;advanced age (>53men, >53 women)     Objective:    Today's Vitals   01/12/20 1032  BP: (!) 144/80  Temp: 97.7 F (36.5 C)  TempSrc: Temporal  Weight: 177 lb 9.6 oz (80.6 kg)  Height: 5\' 5"  (1.651 m)   Body mass index is 29.55 kg/m.  Advanced Directives 01/12/2020 03/08/2018 12/31/2016 12/27/2016 10/10/2015 09/29/2015  Does Patient Have a Medical Advance Directive? Yes No Yes Yes No -  Type of Advance Directive Living will;Healthcare Power of Attorney - Living will Living will - -  Does patient want to make changes to medical advance directive? No - Patient declined - No - Patient declined No - Patient declined - -  Copy of [place] in Chart? No - copy requested - - - - -  Would patient like information on creating a medical advance directive? - No - Patient declined - - No - patient declined information No - patient declined information    Current Medications (verified) Outpatient Encounter Medications as of 01/12/2020  Medication Sig  . Cholecalciferol 25 MCG (1000 UT) capsule Vitamin D3 25 mcg (1,000 unit) capsule   1 capsule every 24 hours by oral route.  . cycloSPORINE (RESTASIS) 0.05 % ophthalmic emulsion Restasis 0.05 %  eye drops in a dropperette  INSTILL 1 DROP IN BOTH EYES TWICE DAILY  . levocetirizine (XYZAL) 5 MG tablet Take 1 tablet (5 mg total) by mouth every evening. For allergy  . levothyroxine (SYNTHROID) 88 MCG tablet TAKE ONE TABLET BY MOUTH DAILY  . Magnesium 300 MG CAPS Take 300-600 mg by mouth daily. depends on constipation if takes 300-600 mg  . Melatonin 10 MG CAPS Take by mouth.  . zolpidem (AMBIEN) 5 MG tablet Take 0.5-1 tablets (2.5-5 mg total) by mouth at bedtime as needed for sleep. (Patient not taking: Reported on 01/12/2020)   No facility-administered encounter medications on file as of 01/12/2020.    Allergies (verified) Codeine, Dilaudid [hydromorphone hcl], and Other   History: Past Medical History:  Diagnosis Date  . Anemia    hx of   . Arthritis   . Hx of colonic polyps   . Hypothyroidism   . Trigger finger    left ring finger   Past Surgical History:  Procedure Laterality Date  . bunion removed    . COLONOSCOPY    . EXPLORATORY LAPAROTOMY    . HAND SURGERY Left 01/04/2020  . POLYPECTOMY    . TOTAL HIP ARTHROPLASTY Right 10/10/2015   Procedure: TOTAL RIGHT HIP ARTHROPLASTY ANTERIOR APPROACH;  Surgeon: Paralee Cancel, MD;  Location: [facility];  Service: Orthopedics;  Laterality: Right;  . TOTAL HIP ARTHROPLASTY  Left 12/31/2016   Procedure: LEFT TOTAL HIP ARTHROPLASTY ANTERIOR APPROACH;  Surgeon: Paralee Cancel, MD;  Location: [facility];  Service: Orthopedics;  Laterality: Left;  Requests 70 mins  . WISDOM TOOTH EXTRACTION     Family History  Problem Relation Age of Onset  . Heart attack Father   . Coronary artery disease Father   . Coronary artery disease Brother        quad bypass age 69  . Colon cancer Neg Hx   . Esophageal cancer Neg Hx   . Prostate cancer Neg Hx   . Rectal cancer Neg Hx    Social History   Socioeconomic History  . Marital status: Married    Spouse name: Denise Garrett  . Number of children: 2  . Years of education: Not on file  . Highest education level:  Not on file  Occupational History    Employer: [employer]    Comment: Retired  Tobacco Use  . Smoking status: Never Smoker  . Smokeless tobacco: Never Used  Substance and Sexual Activity  . Alcohol use: Yes    Comment: 4-5 glasses per weke  . Drug use: No  . Sexual activity: Not on file  Other Topics Concern  . 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 Difficulty of Paying Living Expenses: Not hard at all  Food Insecurity: No Food Insecurity  . Worried About Charity fundraiser in the Last Year: Never true  . Ran Out of Food in the Last Year: Never true  Transportation Needs: No Transportation Needs  . Lack of Transportation (Medical): No  . Lack of Transportation (Non-Medical): No  Physical Activity: Sufficiently Active  . Days of Exercise per Week: 7 days  . Minutes of Exercise per Session: 30 min  Stress: Stress Concern Present  . Feeling of Stress : Very much  Social Connections: Unknown  . Frequency of Communication with Friends and Family: More than three times a week  . Frequency of Social Gatherings with Friends and Family: Not on file  . Attends Religious Services: Not on file  . Active Member of Clubs or Organizations: Not on file  . Attends Archivist Meetings: Not on file  . Marital Status: Married    Tobacco Counseling Counseling given: Not Answered   Clinical Intake:  Pre-visit preparation completed: Yes  Pain : No/denies pain     BMI - recorded: 29.55 Nutritional Status: BMI 25 -29 Overweight Nutritional Risks: None Diabetes: No  How often do you need to have someone help you when you read instructions, pamphlets, or other written materials from  your doctor or pharmacy?: 1 - Never  Interpreter Needed?: No  Information entered by :: Franne Forts, LPN.   Activities of Daily Living In your present state of health, do you have any difficulty performing the following activities: 01/12/2020  Hearing? N  Difficulty concentrating or making decisions? N  Walking or climbing stairs? N  Dressing or bathing? N  Doing errands, shopping? N  Preparing Food and eating ? N  Using the Toilet? N  In the past six months, have  you accidently leaked urine? N  Do you have problems with loss of bowel control? N  Managing your Medications? N  Managing your Finances? N  Housekeeping or managing your Housekeeping? N  Some recent data might be hidden     Immunizations and Health Maintenance Immunization History  Administered Date(s) Administered  . Fluad Quad(high Dose 65+) 08/05/2019  . Influenza Split 09/23/2013  . Influenza Whole 09/25/2010  . Influenza, High Dose Seasonal PF 09/30/2017  . Influenza,inj,Quad PF,6+ Mos 09/07/2014, 09/04/2015, 12/09/2016  . Influenza,inj,quad, With Preservative 09/01/2017, 09/02/2019  . Influenza-Unspecified 10/02/2018, 09/23/2019  . Pneumococcal Conjugate-13 01/05/2018  . Pneumococcal Polysaccharide-23 01/11/2019  . Td 09/25/2010  . Zoster 10/07/2013  . Zoster Recombinat (Shingrix) 08/05/2019   Health Maintenance Due  Topic Date Due  . COLONOSCOPY  01/18/2018    Patient Care Team: Burnis Medin, MD as PCP - General Jola Baptist, DC (Chiropractic Medicine) Paralee Cancel, MD as Consulting Physician (Orthopedic Surgery)  Indicate any recent Medical Services you may have received from other than [facility] providers in the past year (date may be approximate).     Assessment:   This is a routine wellness examination for Denise Garrett.  Hearing/Vision screen No exam data present  Dietary issues and exercise activities discussed: Current Exercise Habits: Home exercise routine, Type of exercise:  walking;stretching;strength training/weights;calisthenics, Time (Minutes): 30, Frequency (Times/Week): 7, Weekly Exercise (Minutes/Week): 210, Intensity: Moderate, Exercise limited by: None identified  Goals    . Increase physical activity     Increase cardio exercise to best control blood pressure    . Patient Stated     Find home in [place] to be close to my grandchildren      Depression Screen PHQ 2/9 Scores 01/12/2020 01/11/2019 01/05/2018  PHQ - 2 Score 3 0 0  PHQ- 9 Score 8 - -    Fall Risk Fall Risk  01/05/2018  Falls in the past year? No    Is the patient's home free of loose throw rugs in walkways, pet beds, electrical cords, etc?   yes      Grab bars in the bathroom? yes      Handrails on the stairs?   yes      Adequate lighting?   yes  Timed Get Up and Go Performed Normal  Cognitive Function:     6CIT Screen 01/12/2020  What Year? 0 points  What month? 0 points  What time? 0 points  Count back from 20 0 points  Months in reverse 0 points  Repeat phrase 0 points  Total Score 0    Screening Tests Health Maintenance  Topic Date Due  . COLONOSCOPY  01/18/2018  . TETANUS/TDAP  09/25/2020  . MAMMOGRAM  10/13/2021  . INFLUENZA VACCINE  Completed  . DEXA SCAN  Completed  . Hepatitis C Screening  Completed  . PNA vac Low Risk Adult  Completed    Qualifies for Shingles Vaccine? Yes; needs only shingrix #2   Cancer Screenings: Lung: Low Dose CT Chest recommended if Age 55-80 years, 30 pack-year currently smoking OR have quit w/in 15years. Patient does not qualify. Breast: Up to date on Mammogram? Yes   Up to date of Bone Density/Dexa? Yes Colorectal: no; patient declines at this time. She is not a candidate for cologuard testing.  Additional Screenings:  Hepatitis C Screening:  Completed 10/17/2013.    Plan:   Mrs. Hanel needs to complete Shingrix #2 and plans to do this after she completes the covid vaccines. She declines  another colonoscopy at this time and  is not a candidate for cologuard testing. She declined antidepressant medication and referral to behavioral health/counseling at this time. CPE was scheduled with PCP next week.   I have personally reviewed and noted the following in the patient's chart:   . Medical and social history . Use of alcohol, tobacco or illicit drugs  . Current medications and supplements . Functional ability and status . Nutritional status . Physical activity . Advanced directives . List of other physicians . Hospitalizations, surgeries, and ER visits in previous 12 months . Vitals . Screenings to include cognitive, depression, and falls . Referrals and appointments  In addition, I have reviewed and discussed with patient certain preventive protocols, quality metrics, and best practice recommendations. A written personalized care plan for preventive services as well as general preventive health recommendations were provided to patient.     Franne Forts, LPN   [id number]

## 2020-01-18 ENCOUNTER — Other Ambulatory Visit: Payer: Self-pay

## 2020-01-18 NOTE — Progress Notes (Signed)
This visit occurred during the SARS-CoV-2 public health emergency.  Safety protocols were in place, including screening questions prior to the visit, additional usage of staff PPE, and extensive cleaning of exam room while observing appropriate contact time as indicated for disinfecting solutions.    Chief Complaint  Patient presents with  . Annual Exam    pt had eye exam and they told her a vessel looks related to high blood pressure and would like to discuss that     HPI: Denise Garrett 69 y.o. comes in today for Preventive Medicare exam Doing ok but feeling down but not depressed since fall   Is moving to [place] to be closere the kids and Karrie Doffing   Looking forward to this  . Shut down and  Winter low lite sometimes causes her to feel blue but not hopeless no anhedonia   [place] getting out golf interacting etc   Eye doctor.  Had retinal exam that was ok but  Asked about poss ht cause may have some changes  To such but didn't say should be evaluated  She found her bp monitorn  Says not  Have a dx of ht or elevation in past  Sleep is  More distrubed  Again  Asks for  ambien refill as rescue. If needed   Doesn't want another colon   Had one with one polyp  No sx  Thyroid ok  No changes Health Maintenance  Topic Date Due  . COLONOSCOPY  01/18/2018  . TETANUS/TDAP  09/25/2020  . MAMMOGRAM  10/13/2021  . INFLUENZA VACCINE  Completed  . DEXA SCAN  Completed  . Hepatitis C Screening  Completed  . PNA vac Low Risk Adult  Completed   Health Maintenance Review LIFESTYLE:  Exercise:   Tobacco/ETS: Alcohol:  Sugar beverages: Sleep: 3-4  Hours since fall.   Just bought New house in [place]    Drug use: no HH: 2 dog and cat.  Husband works Mining engineer  Not retired yet    ROS:  See above  GEN/ HEENT: No fever, significant weight changes sweats headaches vision problems hearing changes, CV/ PULM; No chest pain shortness of breath cough, syncope,edema  change in exercise tolerance. GI /GU:  No adominal pain, vomiting, change in bowel habits. No blood in the stool. No significant GU symptoms. SKIN/HEME: ,no acute skin rashes suspicious lesions or bleeding. No lymphadenopathy, nodules, masses.  NEURO/ PSYCH:  No neurologic signs such as weakness numbness. IMM/ Allergy: No unusual infections.  Allergy .   REST of 12 system review negative except as per HPI   Past Medical History:  Diagnosis Date  . Anemia    hx of   . Arthritis   . Hx of colonic polyps   . Hypothyroidism   . Trigger finger    left ring finger    Family History  Problem Relation Age of Onset  . Heart attack Father   . Coronary artery disease Father   . Coronary artery disease Brother        quad bypass age 72  . Colon cancer Neg Hx   . Esophageal cancer Neg Hx   . Prostate cancer Neg Hx   . Rectal cancer Neg Hx     Social History   Socioeconomic History  . Marital status: Married    Spouse name: Mikki Santee  . Number of children: 2  . Years of education: Not on file  . Highest education level: Not on file  Occupational History  Employer: [employer]    Comment: Retired  Tobacco Use  . Smoking status: Never Smoker  . Smokeless tobacco: Never Used  Substance and Sexual Activity  . Alcohol use: Yes    Comment: 4-5 glasses per weke  . Drug use: No  . Sexual activity: Not on file  Other Topics Concern  . 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 Determinants of Health   Financial Resource Strain: [place]   . Difficulty of Paying Living Expenses: Not hard at all  Food Insecurity: No Food Insecurity  . Worried About Charity fundraiser in the Last Year: Never true  . Ran Out of Food in the Last Year: Never true  Transportation Needs: No Transportation Needs  . Lack of  Transportation (Medical): No  . Lack of Transportation (Non-Medical): No  Physical Activity: Sufficiently Active  . Days of Exercise per Week: 7 days  . Minutes of Exercise per Session: 30 min  Stress: Stress Concern Present  . Feeling of Stress : Very much  Social Connections: Unknown  . Frequency of Communication with Friends and Family: More than three times a week  . Frequency of Social Gatherings with Friends and Family: Not on file  . Attends Religious Services: Not on file  . Active Member of Clubs or Organizations: Not on file  . Attends Archivist Meetings: Not on file  . Marital Status: Married    Outpatient Encounter Medications as of 01/19/2020  Medication Sig  . Cholecalciferol 25 MCG (1000 UT) capsule Vitamin D3 25 mcg (1,000 unit) capsule   1 capsule every 24 hours by oral route.  . cycloSPORINE (RESTASIS) 0.05 % ophthalmic emulsion Restasis 0.05 % eye drops in a dropperette  INSTILL 1 DROP IN BOTH EYES TWICE DAILY  . levocetirizine (XYZAL) 5 MG tablet Take 1 tablet (5 mg total) by mouth every evening. For allergy  . levothyroxine (SYNTHROID) 88 MCG tablet TAKE ONE TABLET BY MOUTH DAILY  . Magnesium 300 MG CAPS Take 300-600 mg by mouth daily. depends on constipation if takes 300-600 mg  . Melatonin 10 MG CAPS Take by mouth.  . zolpidem (AMBIEN) 5 MG tablet Take 0.5-1 tablets (2.5-5 mg total) by mouth at bedtime as needed for sleep.  . [DISCONTINUED] zolpidem (AMBIEN) 5 MG tablet Take 0.5-1 tablets (2.5-5 mg total) by mouth at bedtime as needed for sleep.   No facility-administered encounter medications on file as of 01/19/2020.    EXAM:  BP 134/70 (BP Location: Right Arm, Patient Position: Sitting, Cuff Size: Normal)   Pulse 67   Temp 97.6 F (36.4 C) (Temporal)   Ht 5\' 5"  (1.651 m)   Wt 177 lb 6.4 oz (80.5 kg)   SpO2 97%   BMI 29.52 kg/m   Body mass index is 29.52 kg/m.  Physical Exam: Vital signs reviewed [id number] is a well-developed  well-nourished alert cooperative   who appears stated age in no acute distress.  HEENT: normocephalic atraumatic , Eyes: PERRL EOM's full, conjunctiva clear, ., Ears: no deformity EAC's clear TMs with normal landmarks. Mouth: masked r. NECK: supple without masses, thyromegaly or bruits. CHEST/PULM:  Clear to auscultation and percussion breath sounds  equal no wheeze , rales or rhonchi. No chest wall deformities or tenderness. Mild kyphosis  CV: PMI is nondisplaced, S1 S2 no gallops, murmurs, rubs. Peripheral pulses are full without delay.No JVD . Breast: normal by inspection . No dimpling, discharge, masses, tenderness or discharge . ABDOMEN: Bowel sounds normal nontender  No guard or rebound, no hepato splenomegal no CVA tenderness.   Extremtities:  No clubbing cyanosis or edema, no acute joint swelling or redness no focal atrophy NEURO:  Oriented x3, cranial nerves 3-12 appear to be intact, no obvious focal weakness,gait within normal limits no abnormal reflexes or asymmetrical SKIN: No acute rashes normal turgor, color, no bruising or petechiae. Sun changes extremities  PSYCH: Oriented, good eye contact, no obvious depression anxiety, cognition and judgment appear normal.  Reviewed   LN: no cervical axillary inguinal adenopathy No noted deficits in memory, attention, and speech.   Lab Results  Component Value Date   WBC 4.6 01/19/2020   HGB 14.9 01/19/2020   HCT 44.3 01/19/2020   PLT 234.0 01/19/2020   GLUCOSE 88 01/19/2020   CHOL 210 (H) 01/19/2020   TRIG 103.0 01/19/2020   HDL 40.50 01/19/2020   LDLCALC 149 (H) 01/19/2020   ALT 22 01/19/2020   AST 16 01/19/2020   NA 139 01/19/2020   K 4.6 01/19/2020   CL 105 01/19/2020   CREATININE 0.86 01/19/2020   BUN 16 01/19/2020   CO2 30 01/19/2020   TSH 1.06 01/19/2020   INR 1.01 09/29/2015   MICROALBUR <0.7 01/19/2020    ASSESSMENT AND PLAN:  Discussed the following assessment and plan:  Visit for preventive health  examination  Hypothyroidism, unspecified type - Plan: Basic metabolic panel, CBC with Differential/Platelet, Hepatic function panel, Lipid panel, TSH, Microalbumin / creatinine urine ratio  Medication management - Plan: Basic metabolic panel, CBC with Differential/Platelet, Hepatic function panel, Lipid panel, TSH, Microalbumin / creatinine urine ratio  Elevated LDL cholesterol level - Plan: Basic metabolic panel, CBC with Differential/Platelet, Hepatic function panel, Lipid panel, TSH, Microalbumin / creatinine urine ratio  Sleep disturbance - Plan: Basic metabolic panel, CBC with Differential/Platelet, Hepatic function panel, Lipid panel, TSH, Microalbumin / creatinine urine ratio Due for labs  Colon? decline futhrer colon will review record  Said had single benign polyp and no fam hx Get bp readings and send in  Plan lsi  depending  reluctant to use med if at all possible   Get ,monitor correlated when convenient  Goal less than 130/80  Refill small amount ambien  For rescue  Risk benefit of medication discussed.  Patient Care Team: Gerrit Rafalski, Standley Brooking, MD as PCP - General Jola Baptist, DC (Chiropractic Medicine) Paralee Cancel, MD as Consulting Physician (Orthopedic Surgery)  Patient Instructions  Will reviewed record about colon cancer screening .  Take blood pressure readings twice a day for 10-14  days  2 readings at a sitting   .Send in readings     Then will make a plan for follow up.   Goal is  130/80 or below     Continue lifestyle intervention healthy eating and exercise .  Can help bp readings  Dash eating  Check american heart association interventions.   Will send in  [place] for rescue use  As indecated.      How to Take Your Blood Pressure You can take your blood pressure at home with a machine. You may need to check your blood pressure at home:  To check if you have high blood pressure (hypertension).  To check your blood pressure over time.  To make sure your  blood pressure medicine is working. Supplies needed: You will need a blood pressure machine, or monitor. You can buy one at a drugstore or online. When choosing one:  Choose one with an arm cuff.  Choose one that wraps around your upper arm. Only one finger should fit between your arm and the cuff.  Do not choose one that measures your blood pressure from your wrist or finger. Your doctor can suggest a monitor. How to prepare Avoid these things for 30 minutes before checking your blood pressure:  Drinking caffeine.  Drinking alcohol.  Eating.  Smoking.  Exercising. Five minutes before checking your blood pressure:  Pee.  Sit in a dining chair. Avoid sitting in a soft couch or armchair.  Be quiet. Do not talk. How to take your blood pressure Follow the instructions that came with your machine. If you have a digital blood pressure monitor, these may be the instructions: 1. Sit up straight. 2. Place your feet on the floor. Do not cross your ankles or legs. 3. Rest your left arm at the level of your heart. You may rest it on a table, desk, or chair. 4. Pull up your shirt sleeve. 5. Wrap the blood pressure cuff around the upper part of your left arm. The cuff should be 1 inch (2.5 cm) above your elbow. It is best to wrap the cuff around bare skin. 6. Fit the cuff snugly around your arm. You should be able to place only one finger between the cuff and your arm. 7. Put the cord inside the groove of your elbow. 8. Press the power button. 9. Sit quietly while the cuff fills with air and loses air. 10. Write down the numbers on the screen. 11. Wait 2-3 minutes and then repeat steps 1-10. What do the numbers mean? Two numbers make up your blood pressure. The first number is called systolic pressure. The second is called diastolic pressure. An example of a blood pressure reading is "120 over 80" (or 120/80). If you are an adult and do not have a medical condition, use this guide to  find out if your blood pressure is normal: Normal  First number: below 120.  Second number: below 80. Elevated  First number: 120-129.  Second number: below 80. Hypertension stage 1  First number: 130-139.  Second number: 80-89. Hypertension stage 2  First number: 140 or above.  Second number: 69 or above. Your blood pressure is above normal even if only the top or bottom number is above normal. Follow these instructions at home:  Check your blood pressure as often as your doctor tells you to.  Take your monitor to your next doctor's appointment. Your doctor will: ? Make sure you are using it correctly. ? Make sure it is working right.  Make sure you understand what your blood pressure numbers should be.  Tell your doctor if your medicines are causing side effects. Contact a doctor if:  Your blood pressure keeps being high. Get help right away if:  Your first blood pressure number is higher than 180.  Your second blood pressure number is higher than 120. This information is not intended to replace advice given to you by your health care provider. Make sure you discuss any questions you have with your health care provider. Document Revised: 10/31/2017 Document Reviewed: 04/26/2016 Elsevier Patient Education  2020 [place] Maintenance, Female  Adopting a healthy lifestyle and getting preventive care are important in promoting health and wellness. Ask your health care provider about:  The right schedule for you to have regular tests and exams.  Things you can do on your own to prevent diseases and keep yourself healthy. What should I know about diet, weight, and exercise? Eat a healthy diet   Eat a diet that includes plenty of vegetables, fruits, low-fat dairy products, and lean protein.  Do not eat a lot of foods that are high in solid fats, added sugars, or sodium. Maintain a healthy weight Body mass index (BMI) is used to identify  weight problems. It estimates body fat based on height and weight. Your health care provider can help determine your BMI and help you achieve or maintain a healthy weight. Get regular exercise Get regular exercise. This is one of the most important things you can do for your health. Most adults should:  Exercise for at least 150 minutes each week. The exercise should increase your heart rate and make you sweat (moderate-intensity exercise).  Do strengthening exercises at least twice a week. This is in addition to the moderate-intensity exercise.  Spend less time sitting. Even light physical activity can be beneficial. Watch cholesterol and blood lipids Have your blood tested for lipids and cholesterol at 68 years of age, then have this test every 5 years. Have your cholesterol levels checked more often if:  Your lipid or cholesterol levels are high.  You are older than 68 years of age.  You are at high risk for heart disease. What should I know about cancer screening? Depending on your health history and family history, you may need to have cancer screening at various ages. This may include screening for:  Breast cancer.  Cervical cancer.  Colorectal cancer.  Skin cancer.  Lung cancer. What should I know about heart disease, diabetes, and high blood pressure? Blood pressure and heart disease  High blood pressure causes heart disease and increases the risk of stroke. This is more likely to develop in people who have high blood pressure readings, are of African descent, or are overweight.  Have your blood pressure checked: ? Every 3-5 years if you are 3-35 years of age. ? Every year if you are 44 years old or older. Diabetes Have regular diabetes screenings. This checks your fasting blood sugar level. Have the screening done:  Once every three years after age 29 if you are at a normal weight and have a low risk for diabetes.  More often and at a younger age if you are  overweight or have a high risk for diabetes. What should I know about preventing infection? Hepatitis B If you have a higher risk for hepatitis B, you should be screened for this virus. Talk with your health care provider to find out if you are at risk for hepatitis B infection. Hepatitis C Testing is recommended for:  Everyone born from [birth year] through 1964.  Anyone with known risk factors for hepatitis C. Sexually transmitted infections (STIs)  Get screened for STIs, including gonorrhea and chlamydia, if: ? You are sexually active and are younger than 68 years of age. ? You are older than 68 years of age and your health care provider tells you that you are at risk for this type of infection. ? Your sexual activity has changed since you were last screened, and you are at increased risk for chlamydia or gonorrhea. Ask your health care provider if you are  at risk.  Ask your health care provider about whether you are at high risk for HIV. Your health care provider may recommend a prescription medicine to help prevent HIV infection. If you choose to take medicine to prevent HIV, you should first get tested for HIV. You should then be tested every 3 months for as long as you are taking the medicine. Pregnancy  If you are about to stop having your period (premenopausal) and you may become pregnant, seek counseling before you get pregnant.  Take 400 to 800 micrograms (mcg) of folic acid every day if you become pregnant.  Ask for birth control (contraception) if you want to prevent pregnancy. Osteoporosis and menopause Osteoporosis is a disease in which the bones lose minerals and strength with aging. This can result in bone fractures. If you are 38 years old or older, or if you are at risk for osteoporosis and fractures, ask your health care provider if you should:  Be screened for bone loss.  Take a calcium or vitamin D supplement to lower your risk of fractures.  Be given hormone replacement  therapy (HRT) to treat symptoms of menopause. Follow these instructions at home: Lifestyle  Do not use any products that contain nicotine or tobacco, such as cigarettes, e-cigarettes, and chewing tobacco. If you need help quitting, ask your health care provider.  Do not use street drugs.  Do not share needles.  Ask your health care provider for help if you need support or information about quitting drugs. Alcohol use  Do not drink alcohol if: ? Your health care provider tells you not to drink. ? You are pregnant, may be pregnant, or are planning to become pregnant.  If you drink alcohol: ? Limit how much you use to 0-1 drink a day. ? Limit intake if you are breastfeeding.  Be aware of how much alcohol is in your drink. In the [place] one drink equals one 12 oz bottle of beer (355 mL), one 5 oz glass of wine (148 mL), or one 1 oz glass of hard liquor (44 mL). General instructions  Schedule regular health, dental, and eye exams.  Stay current with your vaccines.  Tell your health care provider if: ? You often feel depressed. ? You have ever been abused or do not feel safe at home. Summary  Adopting a healthy lifestyle and getting preventive care are important in promoting health and wellness.  Follow your health care provider's instructions about healthy diet, exercising, and getting tested or screened for diseases.  Follow your health care provider's instructions on monitoring your cholesterol and blood pressure. This information is not intended to replace advice given to you by your health care provider. Make sure you discuss any questions you have with your health care provider. Document Revised: 11/11/2018 Document Reviewed: 11/11/2018 Elsevier Patient Education  2020 [place] Meya Clutter M.D. Guide lines  If one to two small adenomas are detected, the next surveillance colonoscopy should be performed in 7 to 10 years.  Path report  Pedunculated polyp  measuring 7 mm in size was found in the sigmoid colon; polypectomy was performed using snare cautery  2 2014  Tubular adenoma  No high grade dysplasia

## 2020-01-18 NOTE — Patient Instructions (Addendum)
Denise Garrett reviewed record about colon cancer screening .  Take blood pressure readings twice a day for 10-14  days  2 readings at a sitting   .Send in readings     Then Denise Garrett make a plan for follow up.   Goal is  130/80 or below     Continue lifestyle intervention healthy eating and exercise .  Can help bp readings  Dash eating  Check american heart association interventions.   Denise Garrett send in  [place] for rescue use  As indecated.      How to Take Your Blood Pressure You can take your blood pressure at home with a machine. You may need to check your blood pressure at home:  To check if you have high blood pressure (hypertension).  To check your blood pressure over time.  To make sure your blood pressure medicine is working. Supplies needed: You Denise Garrett need a blood pressure machine, or monitor. You can buy one at a drugstore or online. When choosing one:  Choose one with an arm cuff.  Choose one that wraps around your upper arm. Only one finger should fit between your arm and the cuff.  Do not choose one that measures your blood pressure from your wrist or finger. Your doctor can suggest a monitor. How to prepare Avoid these things for 30 minutes before checking your blood pressure:  Drinking caffeine.  Drinking alcohol.  Eating.  Smoking.  Exercising. Five minutes before checking your blood pressure:  Pee.  Sit in a dining chair. Avoid sitting in a soft couch or armchair.  Be quiet. Do not talk. How to take your blood pressure Follow the instructions that came with your machine. If you have a digital blood pressure monitor, these may be the instructions: 1. Sit up straight. 2. Place your feet on the floor. Do not cross your ankles or legs. 3. Rest your left arm at the level of your heart. You may rest it on a table, desk, or chair. 4. Pull up your shirt sleeve. 5. Wrap the blood pressure cuff around the upper part of your left arm. The cuff should be 1 inch (2.5 cm) above  your elbow. It is best to wrap the cuff around bare skin. 6. Fit the cuff snugly around your arm. You should be able to place only one finger between the cuff and your arm. 7. Put the cord inside the groove of your elbow. 8. Press the power button. 9. Sit quietly while the cuff fills with air and loses air. 10. Write down the numbers on the screen. 11. Wait 2-3 minutes and then repeat steps 1-10. What do the numbers mean? Two numbers make up your blood pressure. The first number is called systolic pressure. The second is called diastolic pressure. An example of a blood pressure reading is "120 over 80" (or 120/80). If you are an adult and do not have a medical condition, use this guide to find out if your blood pressure is normal: Normal  First number: below 120.  Second number: below 80. Elevated  First number: 120-129.  Second number: below 80. Hypertension stage 1  First number: 130-139.  Second number: 80-89. Hypertension stage 2  First number: 140 or above.  Second number: 62 or above. Your blood pressure is above normal even if only the top or bottom number is above normal. Follow these instructions at home:  Check your blood pressure as often as your doctor tells you to.  Take your monitor to  your next doctor's appointment. Your doctor Denise Garrett: ? Make sure you are using it correctly. ? Make sure it is working right.  Make sure you understand what your blood pressure numbers should be.  Tell your doctor if your medicines are causing side effects. Contact a doctor if:  Your blood pressure keeps being high. Get help right away if:  Your first blood pressure number is higher than 180.  Your second blood pressure number is higher than 120. This information is not intended to replace advice given to you by your health care provider. Make sure you discuss any questions you have with your health care provider. Document Revised: 10/31/2017 Document Reviewed:  04/26/2016 Elsevier Patient Education  2020 [place] Maintenance, Female Adopting a healthy lifestyle and getting preventive care are important in promoting health and wellness. Ask your health care provider about:  The right schedule for you to have regular tests and exams.  Things you can do on your own to prevent diseases and keep yourself healthy. What should I know about diet, weight, and exercise? Eat a healthy diet   Eat a diet that includes plenty of vegetables, fruits, low-fat dairy products, and lean protein.  Do not eat a lot of foods that are high in solid fats, added sugars, or sodium. Maintain a healthy weight Body mass index (BMI) is used to identify weight problems. It estimates body fat based on height and weight. Your health care provider can help determine your BMI and help you achieve or maintain a healthy weight. Get regular exercise Get regular exercise. This is one of the most important things you can do for your health. Most adults should:  Exercise for at least 150 minutes each week. The exercise should increase your heart rate and make you sweat (moderate-intensity exercise).  Do strengthening exercises at least twice a week. This is in addition to the moderate-intensity exercise.  Spend less time sitting. Even light physical activity can be beneficial. Watch cholesterol and blood lipids Have your blood tested for lipids and cholesterol at 68 years of age, then have this test every 5 years. Have your cholesterol levels checked more often if:  Your lipid or cholesterol levels are high.  You are older than 68 years of age.  You are at high risk for heart disease. What should I know about cancer screening? Depending on your health history and family history, you may need to have cancer screening at various ages. This may include screening for:  Breast cancer.  Cervical cancer.  Colorectal cancer.  Skin cancer.  Lung  cancer. What should I know about heart disease, diabetes, and high blood pressure? Blood pressure and heart disease  High blood pressure causes heart disease and increases the risk of stroke. This is more likely to develop in people who have high blood pressure readings, are of African descent, or are overweight.  Have your blood pressure checked: ? Every 3-5 years if you are 61-18 years of age. ? Every year if you are 36 years old or older. Diabetes Have regular diabetes screenings. This checks your fasting blood sugar level. Have the screening done:  Once every three years after age 60 if you are at a normal weight and have a low risk for diabetes.  More often and at a younger age if you are overweight or have a high risk for diabetes. What should I know about preventing infection? Hepatitis B If you have a higher risk  for hepatitis B, you should be screened for this virus. Talk with your health care provider to find out if you are at risk for hepatitis B infection. Hepatitis C Testing is recommended for:  Everyone born from [birth year] through 1964.  Anyone with known risk factors for hepatitis C. Sexually transmitted infections (STIs)  Get screened for STIs, including gonorrhea and chlamydia, if: ? You are sexually active and are younger than 68 years of age. ? You are older than 68 years of age and your health care provider tells you that you are at risk for this type of infection. ? Your sexual activity has changed since you were last screened, and you are at increased risk for chlamydia or gonorrhea. Ask your health care provider if you are at risk.  Ask your health care provider about whether you are at high risk for HIV. Your health care provider may recommend a prescription medicine to help prevent HIV infection. If you choose to take medicine to prevent HIV, you should first get tested for HIV. You should then be tested every 3 months for as long as you are taking the  medicine. Pregnancy  If you are about to stop having your period (premenopausal) and you may become pregnant, seek counseling before you get pregnant.  Take 400 to 800 micrograms (mcg) of folic acid every day if you become pregnant.  Ask for birth control (contraception) if you want to prevent pregnancy. Osteoporosis and menopause Osteoporosis is a disease in which the bones lose minerals and strength with aging. This can result in bone fractures. If you are 27 years old or older, or if you are at risk for osteoporosis and fractures, ask your health care provider if you should:  Be screened for bone loss.  Take a calcium or vitamin D supplement to lower your risk of fractures.  Be given hormone replacement therapy (HRT) to treat symptoms of menopause. Follow these instructions at home: Lifestyle  Do not use any products that contain nicotine or tobacco, such as cigarettes, e-cigarettes, and chewing tobacco. If you need help quitting, ask your health care provider.  Do not use street drugs.  Do not share needles.  Ask your health care provider for help if you need support or information about quitting drugs. Alcohol use  Do not drink alcohol if: ? Your health care provider tells you not to drink. ? You are pregnant, may be pregnant, or are planning to become pregnant.  If you drink alcohol: ? Limit how much you use to 0-1 drink a day. ? Limit intake if you are breastfeeding.  Be aware of how much alcohol is in your drink. In the [place] one drink equals one 12 oz bottle of beer (355 mL), one 5 oz glass of wine (148 mL), or one 1 oz glass of hard liquor (44 mL). General instructions  Schedule regular health, dental, and eye exams.  Stay current with your vaccines.  Tell your health care provider if: ? You often feel depressed. ? You have ever been abused or do not feel safe at home. Summary  Adopting a healthy lifestyle and getting preventive care are important in  promoting health and wellness.  Follow your health care provider's instructions about healthy diet, exercising, and getting tested or screened for diseases.  Follow your health care provider's instructions on monitoring your cholesterol and blood pressure. This information is not intended to replace advice given to you by your health care provider. Make sure you discuss any  questions you have with your health care provider. Document Revised: 11/11/2018 Document Reviewed: 11/11/2018 Elsevier Patient Education  2020 [employer].

## 2020-01-19 ENCOUNTER — Encounter: Payer: Self-pay | Admitting: Internal Medicine

## 2020-01-19 ENCOUNTER — Ambulatory Visit (INDEPENDENT_AMBULATORY_CARE_PROVIDER_SITE_OTHER): Payer: Medicare PPO | Admitting: Internal Medicine

## 2020-01-19 VITALS — BP 134/70 | HR 67 | Temp 97.6°F | Ht 65.0 in | Wt 177.4 lb

## 2020-01-19 DIAGNOSIS — Z0000 Encounter for general adult medical examination without abnormal findings: Secondary | ICD-10-CM

## 2020-01-19 DIAGNOSIS — G479 Sleep disorder, unspecified: Secondary | ICD-10-CM | POA: Diagnosis not present

## 2020-01-19 DIAGNOSIS — E7800 Pure hypercholesterolemia, unspecified: Secondary | ICD-10-CM | POA: Diagnosis not present

## 2020-01-19 DIAGNOSIS — Z79899 Other long term (current) drug therapy: Secondary | ICD-10-CM

## 2020-01-19 DIAGNOSIS — E039 Hypothyroidism, unspecified: Secondary | ICD-10-CM

## 2020-01-19 LAB — HEPATIC FUNCTION PANEL
ALT: 22 U/L (ref 0–35)
AST: 16 U/L (ref 0–37)
Albumin: 4.1 g/dL (ref 3.5–5.2)
Alkaline Phosphatase: 62 U/L (ref 39–117)
Bilirubin, Direct: 0.1 mg/dL (ref 0.0–0.3)
Total Bilirubin: 0.5 mg/dL (ref 0.2–1.2)
Total Protein: 6.5 g/dL (ref 6.0–8.3)

## 2020-01-19 LAB — BASIC METABOLIC PANEL
BUN: 16 mg/dL (ref 6–23)
CO2: 30 mEq/L (ref 19–32)
Calcium: 9.8 mg/dL (ref 8.4–10.5)
Chloride: 105 mEq/L (ref 96–112)
Creatinine, Ser: 0.86 mg/dL (ref 0.40–1.20)
GFR: 65.65 mL/min (ref >60.00)
Glucose, Bld: 88 mg/dL (ref 70–99)
Potassium: 4.6 mEq/L (ref 3.5–5.1)
Sodium: 139 mEq/L (ref 135–145)

## 2020-01-19 LAB — CBC WITH DIFFERENTIAL/PLATELET
Basophils Absolute: 0 10*3/uL (ref 0.0–0.1)
Basophils Relative: 0.9 % (ref 0.0–3.0)
Eosinophils Absolute: 0.1 10*3/uL (ref 0.0–0.7)
Eosinophils Relative: 1.2 % (ref 0.0–5.0)
HCT: 44.3 % (ref 36.0–46.0)
Hemoglobin: 14.9 g/dL (ref 12.0–15.0)
Lymphocytes Relative: 30 % (ref 12.0–46.0)
Lymphs Abs: 1.4 10*3/uL (ref 0.7–4.0)
MCHC: 33.5 g/dL (ref 30.0–36.0)
MCV: 88.3 fl (ref 78.0–100.0)
Monocytes Absolute: 0.3 10*3/uL (ref 0.1–1.0)
Monocytes Relative: 7.1 % (ref 3.0–12.0)
Neutro Abs: 2.8 10*3/uL (ref 1.4–7.7)
Neutrophils Relative %: 60.8 % (ref 43.0–77.0)
Platelets: 234 10*3/uL (ref 150.0–400.0)
RBC: 5.02 Mil/uL (ref 3.87–5.11)
RDW: 13.6 % (ref 11.5–15.5)
WBC: 4.6 10*3/uL (ref 4.0–10.5)

## 2020-01-19 LAB — LIPID PANEL
Cholesterol: 210 mg/dL — ABNORMAL HIGH (ref 0–200)
HDL: 40.5 mg/dL (ref >39.00)
LDL Cholesterol: 149 mg/dL — ABNORMAL HIGH (ref 0–99)
NonHDL: 169.52
Total CHOL/HDL Ratio: 5
Triglycerides: 103 mg/dL (ref 0.0–149.0)
VLDL: 20.6 mg/dL (ref 0.0–40.0)

## 2020-01-19 LAB — TSH: TSH: 1.06 u[IU]/mL (ref 0.35–4.50)

## 2020-01-19 LAB — MICROALBUMIN / CREATININE URINE RATIO
Creatinine,U: 24 mg/dL
Microalb Creat Ratio: 2.9 mg/g (ref 0.0–30.0)
Microalb, Ur: <0.7 mg/dL (ref 0.0–1.9)

## 2020-01-19 MED ORDER — ZOLPIDEM TARTRATE 5 MG PO TABS: 2.5000 mg | ORAL_TABLET | Freq: Every evening | ORAL | 0 refills | Status: AC | PRN

## 2020-02-05 NOTE — Progress Notes (Signed)
Results are stable excpt cholesterol is up and adivse Intensify lifestyle interventions.  To decrease  levels. Please get [place] an update  on your BP readings  as discussed in  visit .

## 2020-03-04 ENCOUNTER — Other Ambulatory Visit: Payer: Self-pay | Admitting: Internal Medicine

## 2020-03-28 DIAGNOSIS — L570 Actinic keratosis: Secondary | ICD-10-CM | POA: Diagnosis not present

## 2020-06-27 DIAGNOSIS — D1801 Hemangioma of skin and subcutaneous tissue: Secondary | ICD-10-CM | POA: Diagnosis not present

## 2020-06-27 DIAGNOSIS — D225 Melanocytic nevi of trunk: Secondary | ICD-10-CM | POA: Diagnosis not present

## 2020-06-27 DIAGNOSIS — L578 Other skin changes due to chronic exposure to nonionizing radiation: Secondary | ICD-10-CM | POA: Diagnosis not present

## 2020-06-27 DIAGNOSIS — D2260 Melanocytic nevi of unspecified upper limb, including shoulder: Secondary | ICD-10-CM | POA: Diagnosis not present

## 2020-06-27 DIAGNOSIS — L570 Actinic keratosis: Secondary | ICD-10-CM | POA: Diagnosis not present

## 2020-06-27 DIAGNOSIS — D2270 Melanocytic nevi of unspecified lower limb, including hip: Secondary | ICD-10-CM | POA: Diagnosis not present

## 2020-06-27 DIAGNOSIS — L821 Other seborrheic keratosis: Secondary | ICD-10-CM | POA: Diagnosis not present

## 2020-06-27 DIAGNOSIS — L738 Other specified follicular disorders: Secondary | ICD-10-CM | POA: Diagnosis not present

## 2020-06-27 DIAGNOSIS — L814 Other melanin hyperpigmentation: Secondary | ICD-10-CM | POA: Diagnosis not present

## 2020-09-07 DIAGNOSIS — H04129 Dry eye syndrome of unspecified lacrimal gland: Secondary | ICD-10-CM | POA: Diagnosis not present

## 2020-09-07 DIAGNOSIS — H018 Other specified inflammations of eyelid: Secondary | ICD-10-CM | POA: Diagnosis not present

## 2020-09-07 DIAGNOSIS — H2513 Age-related nuclear cataract, bilateral: Secondary | ICD-10-CM | POA: Diagnosis not present

## 2020-12-07 DIAGNOSIS — Z13220 Encounter for screening for lipoid disorders: Secondary | ICD-10-CM | POA: Diagnosis not present

## 2020-12-07 DIAGNOSIS — Z131 Encounter for screening for diabetes mellitus: Secondary | ICD-10-CM | POA: Diagnosis not present

## 2020-12-07 DIAGNOSIS — Z23 Encounter for immunization: Secondary | ICD-10-CM | POA: Diagnosis not present

## 2020-12-07 DIAGNOSIS — Z8249 Family history of ischemic heart disease and other diseases of the circulatory system: Secondary | ICD-10-CM | POA: Diagnosis not present

## 2020-12-07 DIAGNOSIS — Z7689 Persons encountering health services in other specified circumstances: Secondary | ICD-10-CM | POA: Diagnosis not present

## 2020-12-07 DIAGNOSIS — Z136 Encounter for screening for cardiovascular disorders: Secondary | ICD-10-CM | POA: Diagnosis not present

## 2020-12-07 DIAGNOSIS — L409 Psoriasis, unspecified: Secondary | ICD-10-CM | POA: Diagnosis not present

## 2020-12-07 DIAGNOSIS — L309 Dermatitis, unspecified: Secondary | ICD-10-CM | POA: Diagnosis not present

## 2020-12-07 DIAGNOSIS — Z8639 Personal history of other endocrine, nutritional and metabolic disease: Secondary | ICD-10-CM | POA: Diagnosis not present

## 2020-12-13 ENCOUNTER — Other Ambulatory Visit: Payer: Self-pay | Admitting: Internal Medicine

## 2020-12-13 ENCOUNTER — Other Ambulatory Visit: Payer: Self-pay

## 2020-12-13 MED ORDER — LEVOCETIRIZINE DIHYDROCHLORIDE 5 MG PO TABS: 5.0000 mg | ORAL_TABLET | Freq: Every evening | ORAL | 0 refills | Status: DC

## 2021-01-12 ENCOUNTER — Other Ambulatory Visit: Payer: Self-pay | Admitting: Internal Medicine

## 2021-01-12 ENCOUNTER — Telehealth: Payer: Self-pay | Admitting: Internal Medicine

## 2021-01-12 NOTE — Telephone Encounter (Signed)
Patient called to let us know that she now lives in [place] and goes to [place].

## 2021-01-12 NOTE — Telephone Encounter (Signed)
Left message for patient to call back and schedule Medicare Annual Wellness Visit (AWV) either virtually or in office. Did not leave detailed message  Last AWV 01/12/20  please schedule at anytime with [facility] Nurse Health Advisor 1 or 2   This should be a 45 minute visit.

## 2021-01-23 DIAGNOSIS — H018 Other specified inflammations of eyelid: Secondary | ICD-10-CM | POA: Diagnosis not present

## 2021-01-23 DIAGNOSIS — H04129 Dry eye syndrome of unspecified lacrimal gland: Secondary | ICD-10-CM | POA: Diagnosis not present

## 2021-01-23 DIAGNOSIS — H04123 Dry eye syndrome of bilateral lacrimal glands: Secondary | ICD-10-CM | POA: Diagnosis not present

## 2021-01-25 DIAGNOSIS — Z1231 Encounter for screening mammogram for malignant neoplasm of breast: Secondary | ICD-10-CM | POA: Diagnosis not present

## 2021-01-25 DIAGNOSIS — N6320 Unspecified lump in the left breast, unspecified quadrant: Secondary | ICD-10-CM | POA: Diagnosis not present

## 2021-01-25 DIAGNOSIS — N6310 Unspecified lump in the right breast, unspecified quadrant: Secondary | ICD-10-CM | POA: Diagnosis not present

## 2021-01-25 DIAGNOSIS — Z1239 Encounter for other screening for malignant neoplasm of breast: Secondary | ICD-10-CM | POA: Diagnosis not present

## 2021-01-30 DIAGNOSIS — R928 Other abnormal and inconclusive findings on diagnostic imaging of breast: Secondary | ICD-10-CM | POA: Diagnosis not present

## 2021-01-30 DIAGNOSIS — N63 Unspecified lump in unspecified breast: Secondary | ICD-10-CM | POA: Diagnosis not present

## 2021-03-12 ENCOUNTER — Telehealth: Payer: Self-pay | Admitting: Internal Medicine

## 2021-03-12 NOTE — Telephone Encounter (Signed)
Left message for patient to call back and schedule Medicare Annual Wellness Visit (AWV) either virtually or in office. No detailed message left   Last AWV 01/12/20  please schedule at anytime with [facility] Nurse Health Advisor 1 or 2   This should be a 45 minute visit.

## 2021-03-13 ENCOUNTER — Other Ambulatory Visit: Payer: Self-pay | Admitting: Internal Medicine

## 2021-03-15 ENCOUNTER — Other Ambulatory Visit: Payer: Self-pay | Admitting: Internal Medicine

## 2021-03-16 ENCOUNTER — Other Ambulatory Visit: Payer: Self-pay | Admitting: Internal Medicine

## 2021-03-20 ENCOUNTER — Other Ambulatory Visit: Payer: Self-pay | Admitting: Internal Medicine

## 2021-04-16 DIAGNOSIS — Z0000 Encounter for general adult medical examination without abnormal findings: Secondary | ICD-10-CM | POA: Diagnosis not present

## 2021-04-16 DIAGNOSIS — Z1211 Encounter for screening for malignant neoplasm of colon: Secondary | ICD-10-CM | POA: Diagnosis not present

## 2021-04-16 DIAGNOSIS — Z1159 Encounter for screening for other viral diseases: Secondary | ICD-10-CM | POA: Diagnosis not present

## 2021-04-16 DIAGNOSIS — M79605 Pain in left leg: Secondary | ICD-10-CM | POA: Diagnosis not present

## 2021-04-16 DIAGNOSIS — Z1231 Encounter for screening mammogram for malignant neoplasm of breast: Secondary | ICD-10-CM | POA: Diagnosis not present

## 2021-05-03 DIAGNOSIS — M1712 Unilateral primary osteoarthritis, left knee: Secondary | ICD-10-CM | POA: Diagnosis not present

## 2021-05-03 DIAGNOSIS — M25551 Pain in right hip: Secondary | ICD-10-CM | POA: Diagnosis not present

## 2021-05-03 DIAGNOSIS — M25552 Pain in left hip: Secondary | ICD-10-CM | POA: Diagnosis not present
